# Patient Record
Sex: Female | Born: 1955 | Race: White | Hispanic: No | Smoking: Never smoker
Health system: Southern US, Academic
[De-identification: ages and names within clinical notes are randomized; demographics above are authoritative.]

## PROBLEM LIST (undated history)

## (undated) DIAGNOSIS — E538 Deficiency of other specified B group vitamins: Secondary | ICD-10-CM

## (undated) DIAGNOSIS — E559 Vitamin D deficiency, unspecified: Secondary | ICD-10-CM

## (undated) DIAGNOSIS — G4730 Sleep apnea, unspecified: Secondary | ICD-10-CM

## (undated) DIAGNOSIS — K219 Gastro-esophageal reflux disease without esophagitis: Secondary | ICD-10-CM

## (undated) DIAGNOSIS — I10 Essential (primary) hypertension: Secondary | ICD-10-CM

## (undated) DIAGNOSIS — D649 Anemia, unspecified: Secondary | ICD-10-CM

## (undated) HISTORY — PX: HX BREAST REDUCTION: SHX8

## (undated) HISTORY — DX: Essential (primary) hypertension: I10

## (undated) HISTORY — DX: Vitamin D deficiency, unspecified: E55.9

## (undated) HISTORY — PX: HX GASTRIC BYPASS: SHX52

## (undated) HISTORY — PX: HX GALL BLADDER SURGERY/CHOLE: SHX55

## (undated) HISTORY — PX: HX REFRACTIVE SURGERY: SHX103

## (undated) HISTORY — DX: Sleep apnea, unspecified: G47.30

## (undated) HISTORY — DX: Gastro-esophageal reflux disease without esophagitis: K21.9

## (undated) HISTORY — PX: CERVICAL SPINE SURGERY: SHX589

## (undated) HISTORY — DX: Anemia, unspecified: D64.9

## (undated) HISTORY — DX: Deficiency of other specified B group vitamins: E53.8

## (undated) HISTORY — PX: INCONTINENCE SURGERY: SHX676

## (undated) HISTORY — PX: HX GASTRIC SLEEVE: 2100003104

## (undated) HISTORY — PX: SALPINGECTOMY: SHX328

## (undated) HISTORY — PX: HX CARPAL TUNNEL RELEASE: SHX101

## (undated) HISTORY — PX: HX BACK SURGERY: SHX140

## (undated) HISTORY — PX: ELBOW SURGERY: SHX618

## (undated) HISTORY — PX: ORTHOPEDIC SURGERY: SHX850

---

## 1992-09-13 ENCOUNTER — Other Ambulatory Visit (HOSPITAL_COMMUNITY): Payer: Self-pay

## 2014-02-23 IMAGING — CT CT LUMBAR SPINE W/O CONTRAST
2 of 6 series · 11 of 20 positions shown, 13 images · non-contrast
Comparison: None.

Exam:   
Lumbar spine CT without contrast, low dose Safe CT protocol
HISTORY: Low back pain, bilateral leg pain.
TECHNIQUE: Multiple axial contiguous CT images of the lumbar spine submitted. Total DLP 769 mGy cm.

[Series 902: spine bone (safect) · axial · 0.38mm/px · z∈[-556,-372]mm · 8 of 120 slices shown, 10 images]
[im 14/120  soft-tissue]
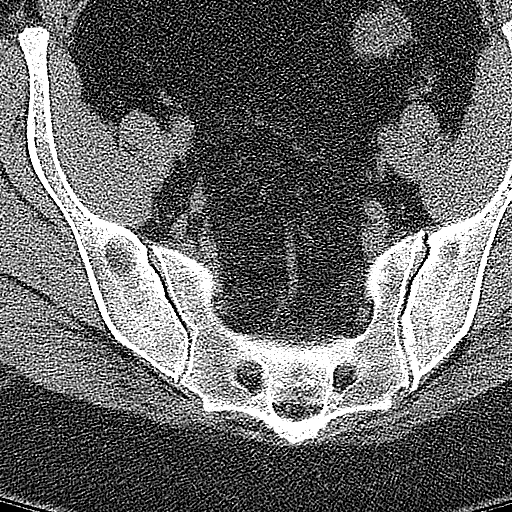
[im 14/120  bone]
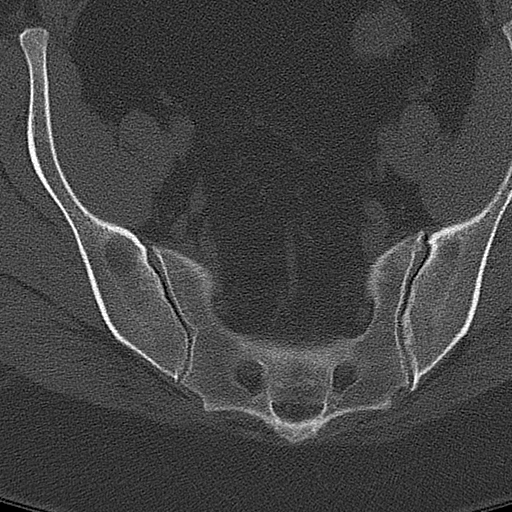
[im 27/120  bone]
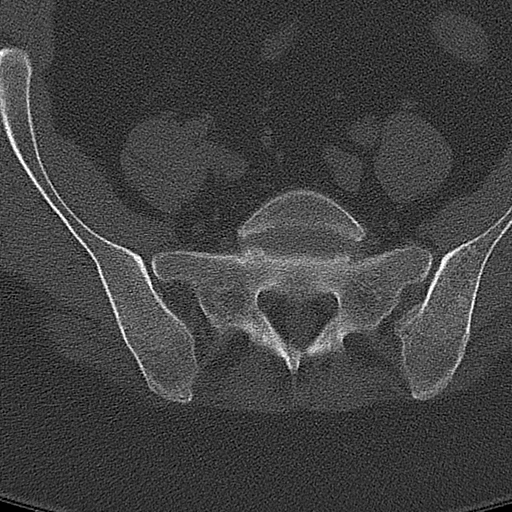
[im 40/120  bone]
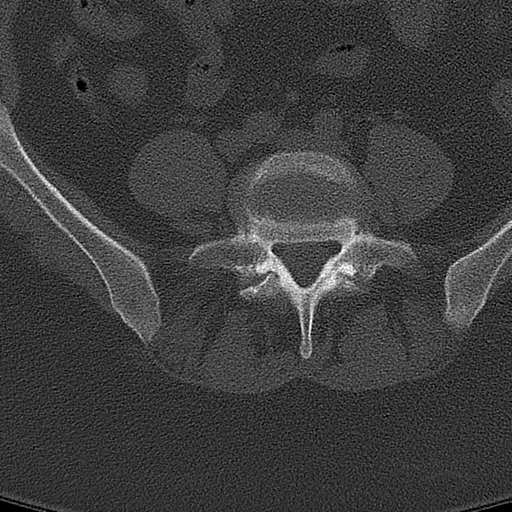
[im 53/120  bone]
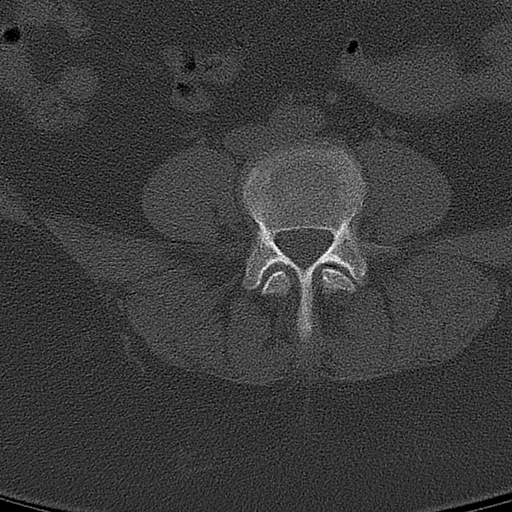
[im 67/120  soft-tissue]
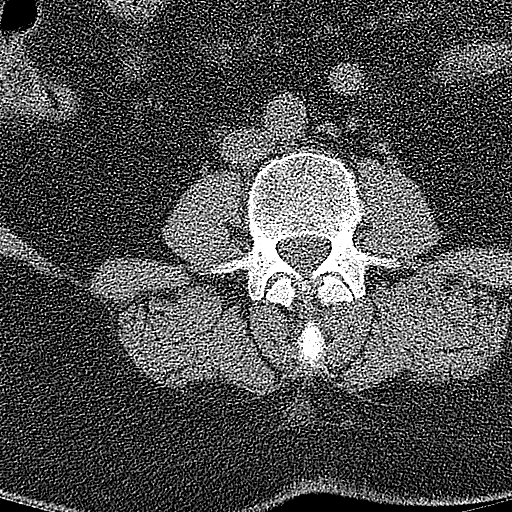
[im 67/120  bone]
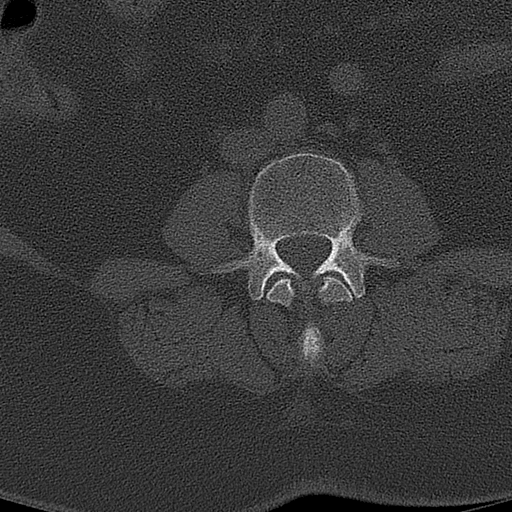
[im 80/120  bone]
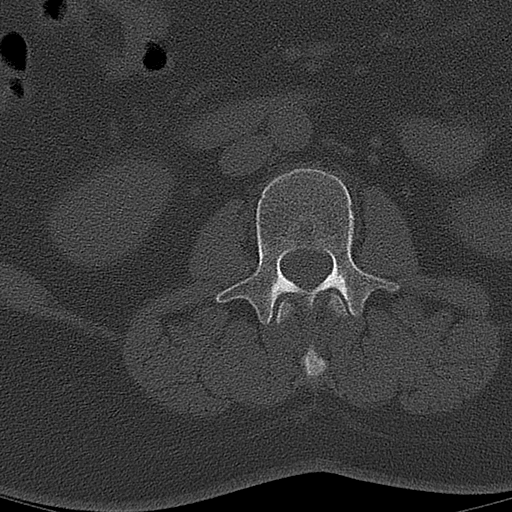
[im 93/120  bone]
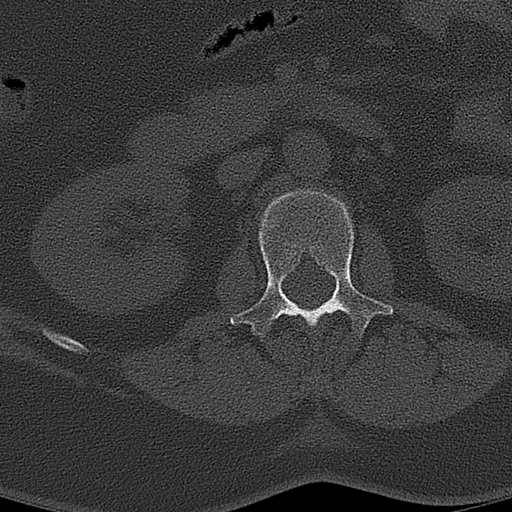
[im 106/120  bone]
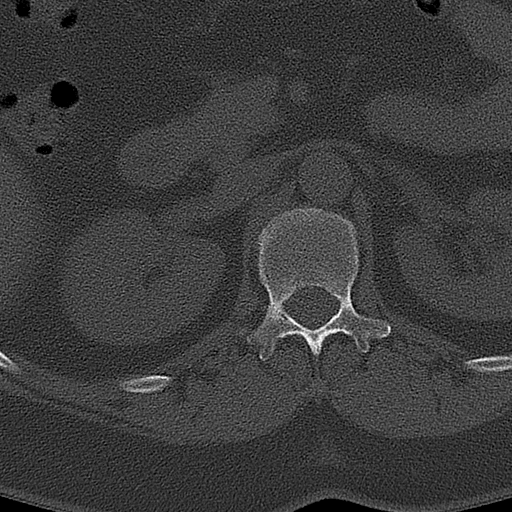

[Series 904: spine bone cor (safect) · coronal · 0.47mm/px · 3 of 56 slices shown]
[im 12/56  bone]
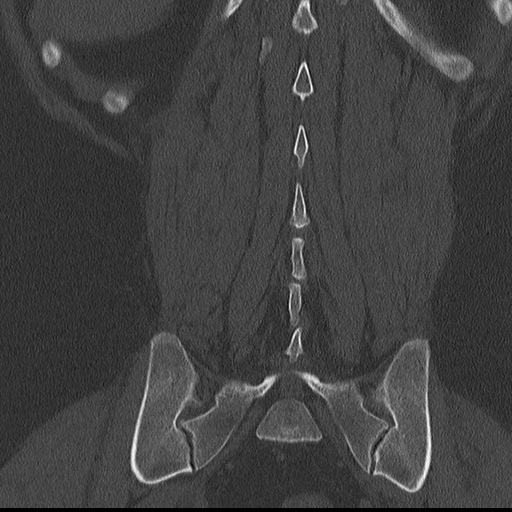
[im 23/56  bone]
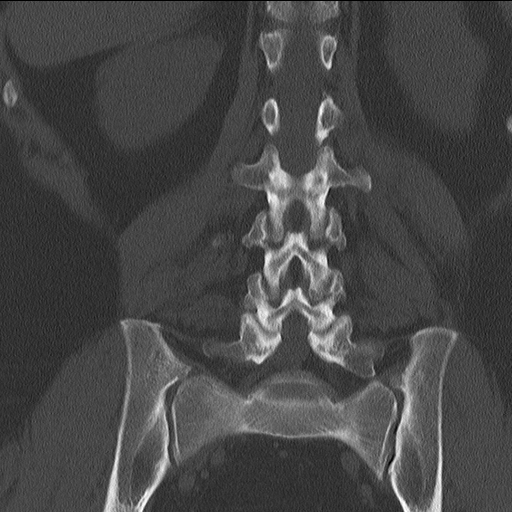
[im 34/56  bone]
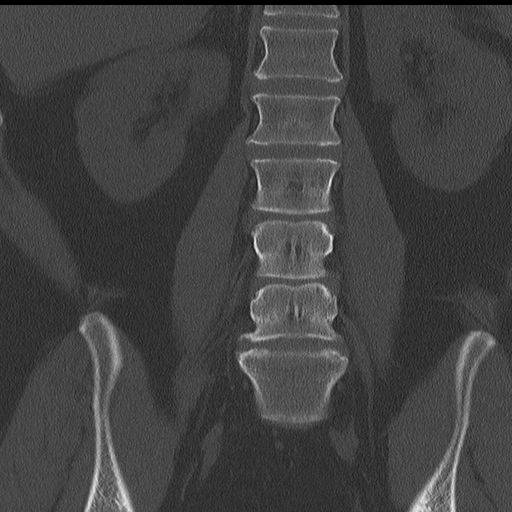

[11 of 20 positions shown; findings below may reference images not displayed]

FINDINGS: L1-2: Normal. 
L2-3: Broad disc bulge effacing the ventral thecal sac resulting in moderate central canal narrowing. There is mild facet disease without significant osseous neural foraminal narrowing. 
L3-4: Broad disc bulge effacing the ventral thecal sac resulting in mild central canal narrowing. There is no significant osseous neural foraminal narrowing. 
L4-5: There is moderate disc desiccation at this level. Circumferential broad disc bulge resulting in moderate to severe narrowing of the central canal. Additionally there is moderate facet arthrosis resulting in moderate biforaminal osseous narrowing. 
L5-S1: Small broad disc bulge without significant central canal narrowing. There is facet arthrosis resulting in mild biforaminal narrowing.
IMPRESSION: 1.
Multi level spondylosis most pronounced at the L4-5 level with broad disc bulge resulting in moderate to severe central canal and moderate biforaminal narrowing.

## 2016-11-03 DIAGNOSIS — G4730 Sleep apnea, unspecified: Secondary | ICD-10-CM

## 2016-11-03 HISTORY — DX: Sleep apnea, unspecified: G47.30

## 2021-03-08 IMAGING — MR MRI CERVICAL SPINE WITHOUT CONTRAST
4 of 5 series · 23 of 48 positions shown · IV contrast (gadolinium)
Comparison: None available.

﻿EXAM:  [id number]   MRI CERVICAL SPINE WITHOUT CONTRAST
INDICATION: Neck pain with bilateral upper extremity radiculopathy. History of neck surgery in 2558.
TECHNIQUE: Multiplanar multisequential MRI of the cervical spine was performed without gadolinium contrast.

[Series 5: T2 · sagittal · 3.0mm · 0.75mm/px · 8 of 15 slices shown (1 of 2)]
[im 1/15]
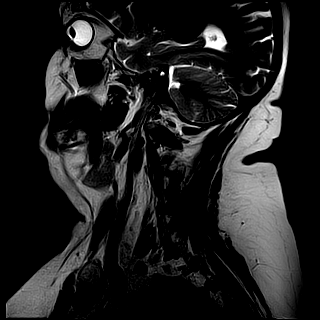
[im 3/15]
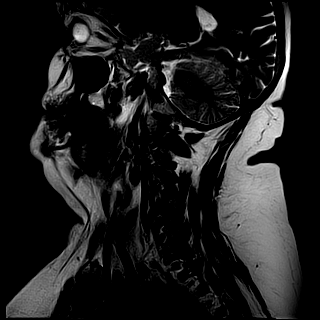
[im 5/15]
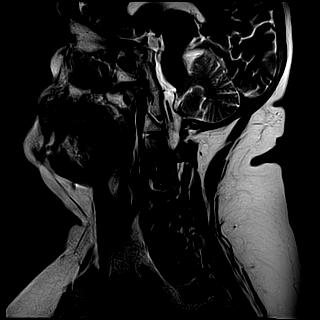
[im 7/15]
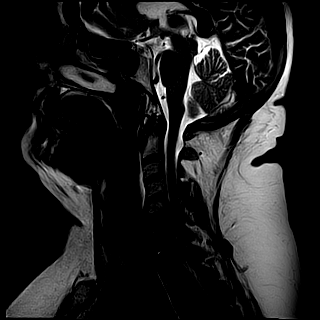
[im 9/15]
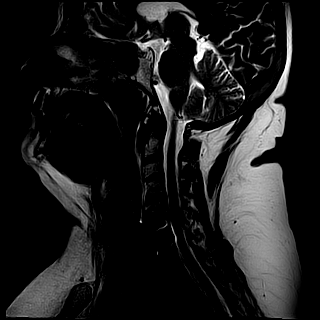
[im 11/15]
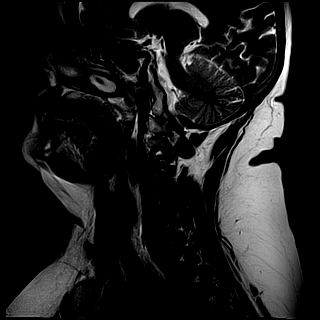
[im 13/15]
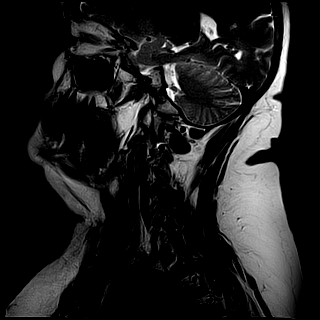
[im 15/15]
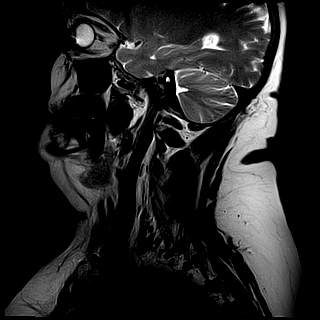

[Series 6: T1 · sagittal · 3.0mm · 0.47mm/px · 3 of 15 slices shown]
[im 2/15]
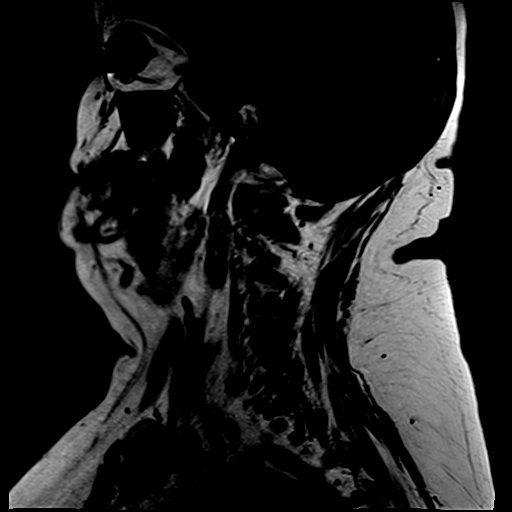
[im 8/15]
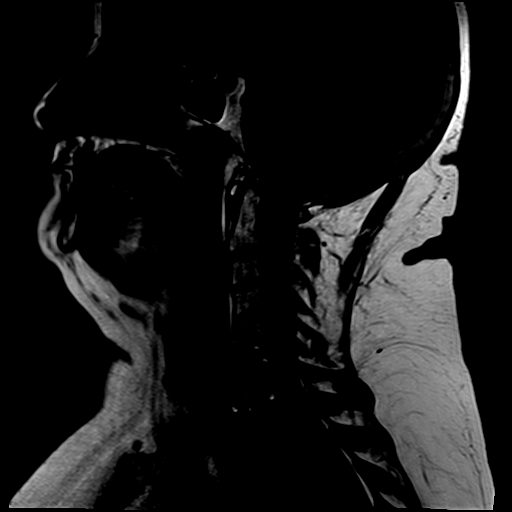
[im 13/15]
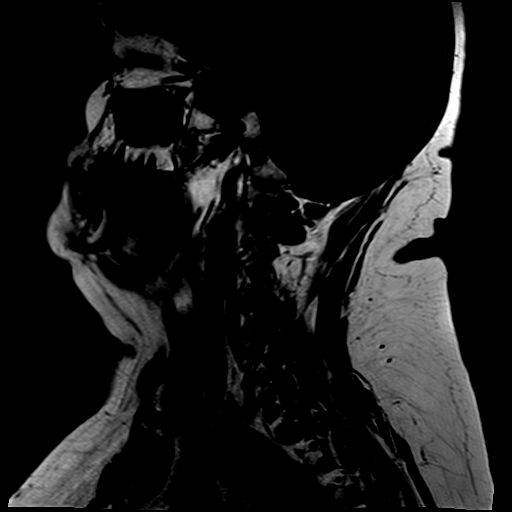

[Series 7: STIR · sagittal · 3.0mm · 0.47mm/px · 3 of 15 slices shown]
[im 2/15]
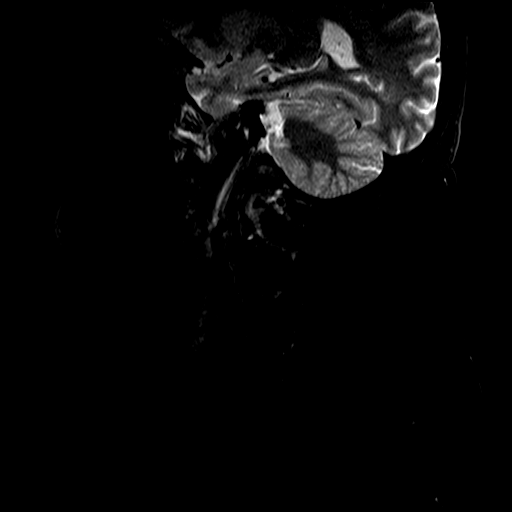
[im 8/15]
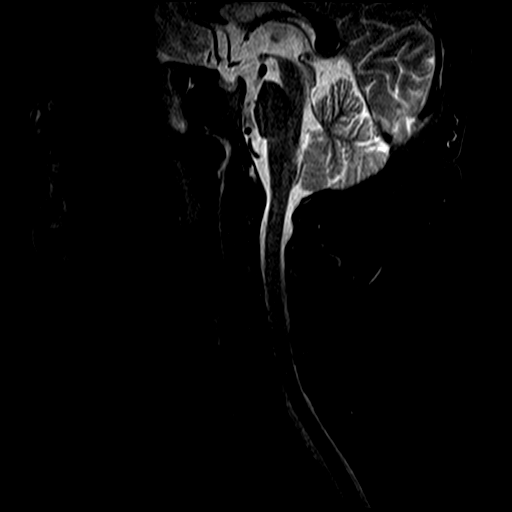
[im 13/15]
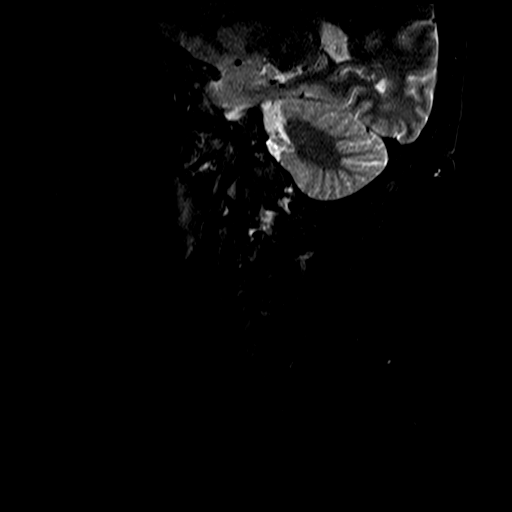

[Series 9: T2 · axial · 3.0mm · 0.39mm/px · z∈[-105,-22]mm · 9 of 18 slices shown (2 of 2)]
[im 1/18]
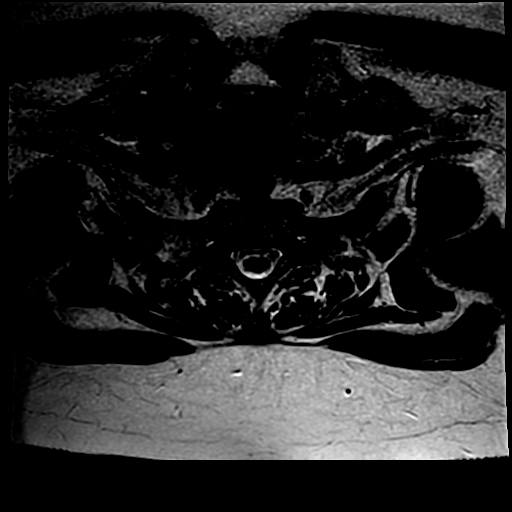
[im 2/18]
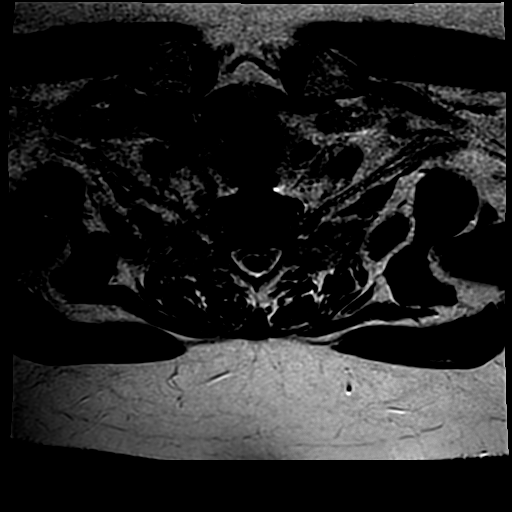
[im 4/18]
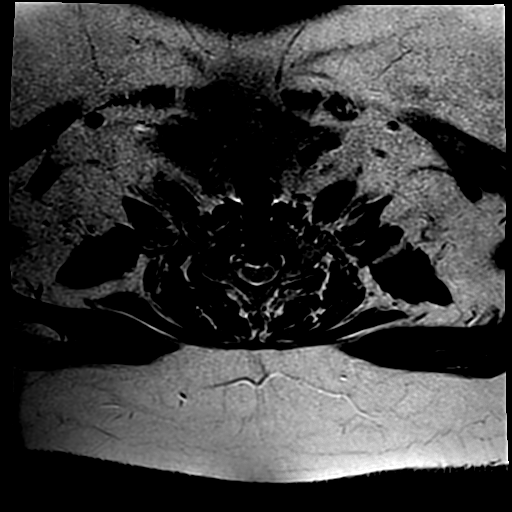
[im 6/18]
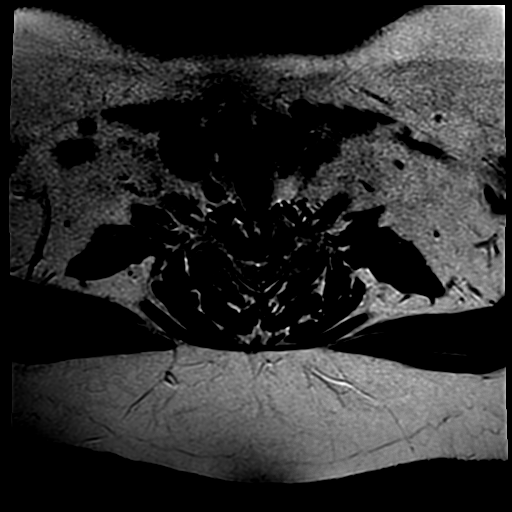
[im 7/18]
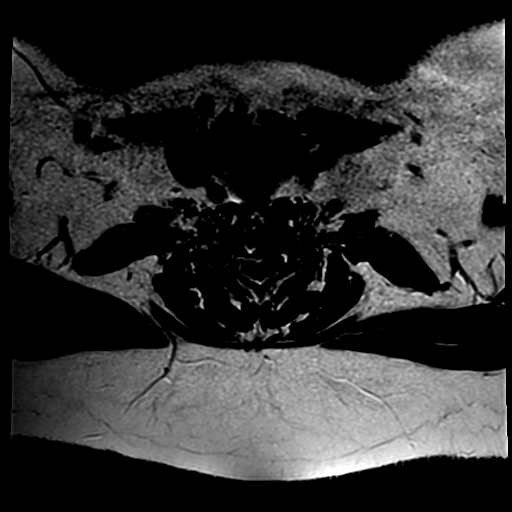
[im 9/18]
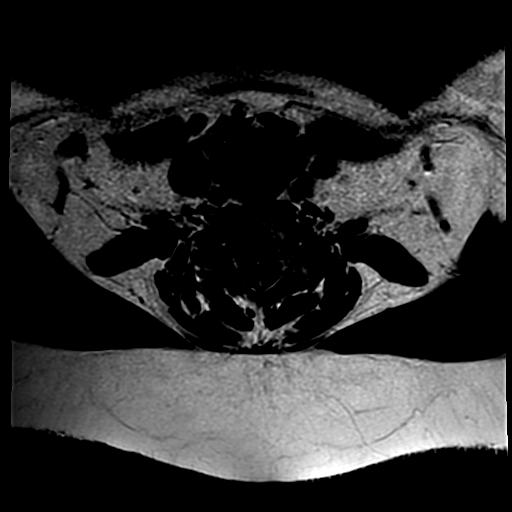
[im 11/18]
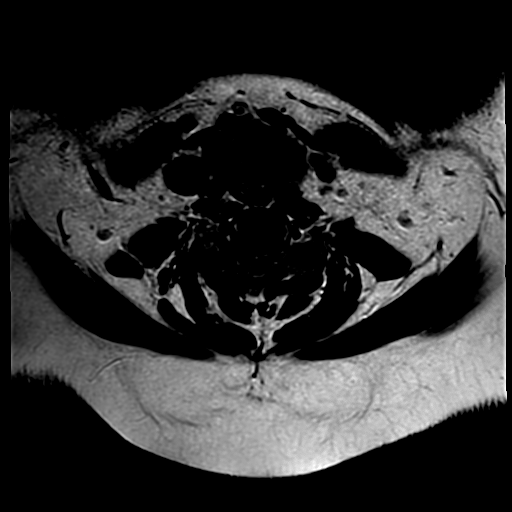
[im 12/18]
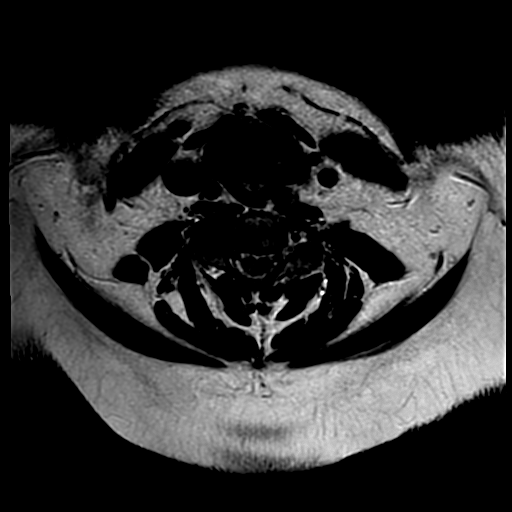
[im 16/18]
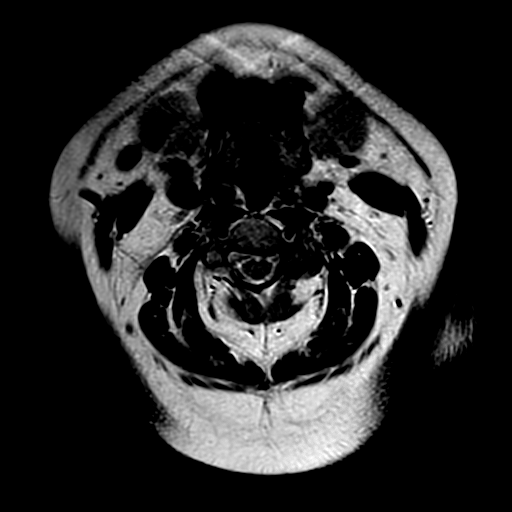

[23 of 48 positions shown; findings below may reference images not displayed]

FINDINGS: Vertebral bodies are normal in height, alignment and signal intensity. There is no acute fracture or subluxation. There is anterior surgical fusion of C6 and C7 vertebral bodies with metallic plate and screws. Visualized spinal cord is normal in signal intensity without evidence of compression at any level.

At C2-3 level, there is moderate to severe left neural foraminal stenosis from facet and uncovertebral joint hypertrophy.

At C3-4 level, there is severe bilateral neural foraminal stenosis from facet and uncovertebral joint hypertrophy.

At C4-5 level, there is mild-to-moderate right neural foraminal stenosis from facet and uncovertebral joint hypertrophy.

At C5-6 level, there is a small broad-based central disc bulge resulting in mild spinal stenosis. There is no significant neural foraminal stenosis.

At C6-7 level, there is mild-to-moderate bilateral neural foraminal stenosis from facet and uncovertebral joint hypertrophy.

At C7-T1 level, there is a small broad-based central disc bulge, mildly effacing the ventral CSF. There is moderate to severe left neural foraminal stenosis from facet and uncovertebral joint hypertrophy.

Paraspinal soft tissues are unremarkable.
IMPRESSION: 1. Anterior surgical fusion of C6 and C7 vertebral bodies. 

2. Mild spinal stenosis at C5-6 level from a small central disc bulge. 

3. Multilevel neural foraminal stenosis as detailed above.

## 2021-03-08 IMAGING — MR MRI LUMBAR SPINE WITHOUT CONTRAST
6 series · 48 of 48 positions shown · IV contrast (gadolinium)
Comparison: CT lumbar spine dated 02/23/2014.

﻿EXAM:  [id number]   MRI LUMBAR SPINE WITHOUT CONTRAST
INDICATION: Lower back pain.
TECHNIQUE: Multiplanar multisequential MRI of the lumbar spine was performed without gadolinium contrast.

[Series 4: T2 · sagittal · 5.0mm · 1.00mm/px · 5 of 13 slices shown (1 of 3)]
[im 1/13]
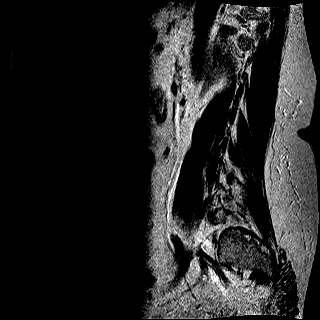
[im 4/13]
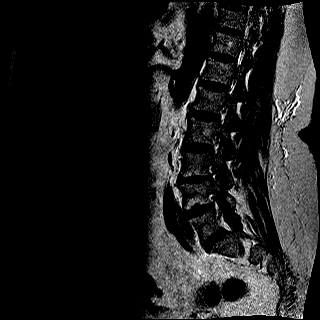
[im 7/13]
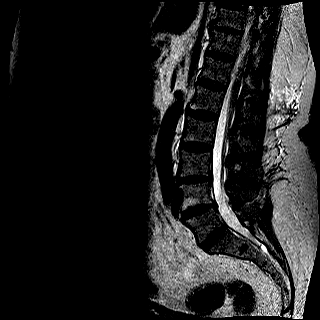
[im 10/13]
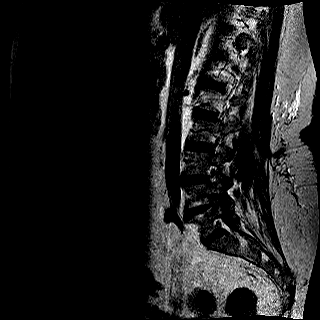
[im 13/13]
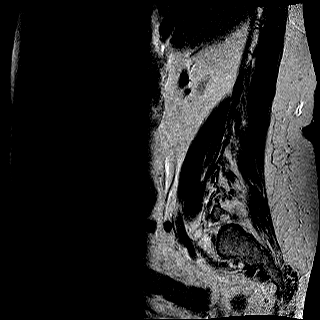

[Series 5: T1 · sagittal · 5.0mm · 1.00mm/px · 6 of 13 slices shown (1 of 2)]
[im 1/13]
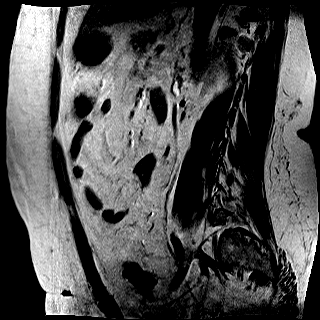
[im 3/13]
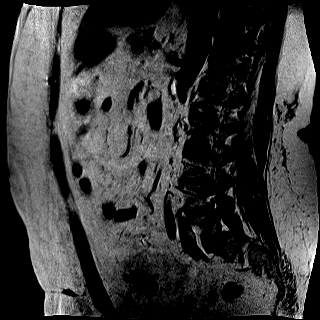
[im 5/13]
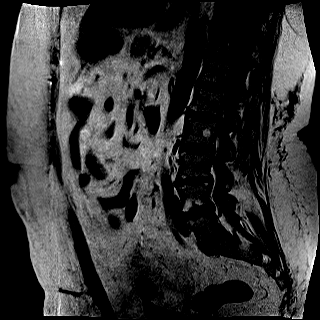
[im 8/13]
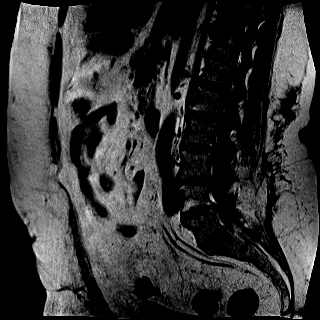
[im 10/13]
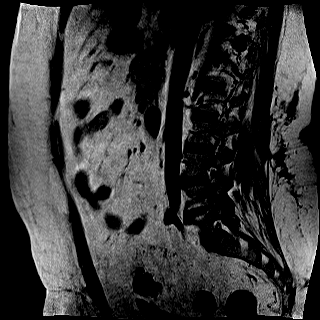
[im 13/13]
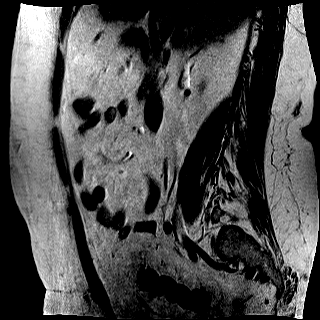

[Series 6: STIR · sagittal · 5.0mm · 1.25mm/px · 6 of 13 slices shown]
[im 1/13]
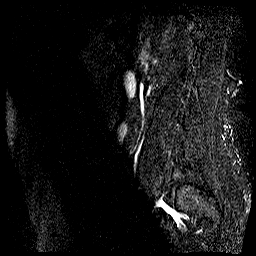
[im 3/13]
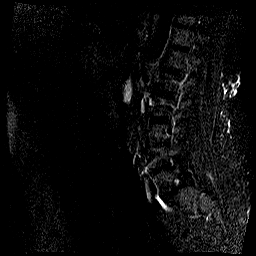
[im 5/13]
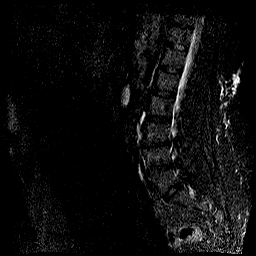
[im 8/13]
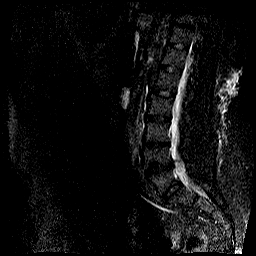
[im 10/13]
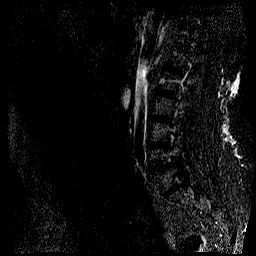
[im 13/13]
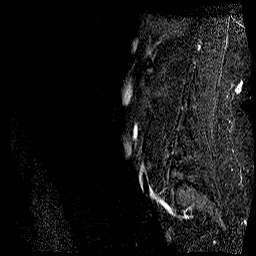

[Series 7: T2 · axial · 5.0mm · 0.89mm/px · z∈[-128,+78]mm · 11 of 25 slices shown (2 of 3)]
[im 1/25]
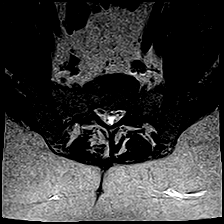
[im 3/25]
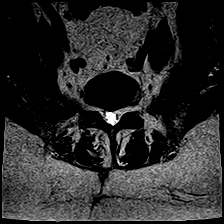
[im 5/25]
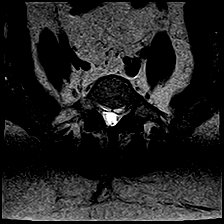
[im 8/25]
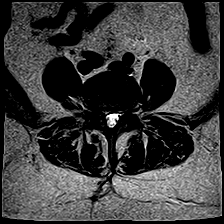
[im 10/25]
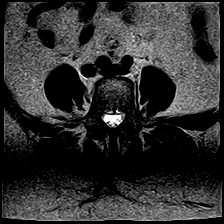
[im 13/25]
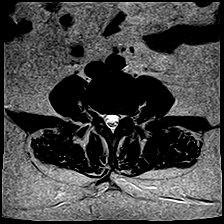
[im 15/25]
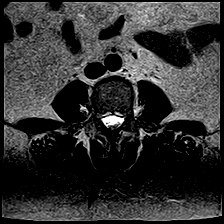
[im 17/25]
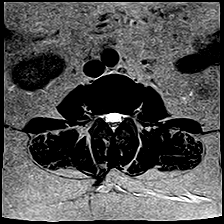
[im 20/25]
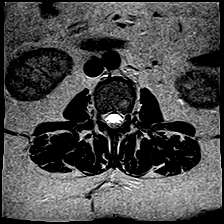
[im 22/25]
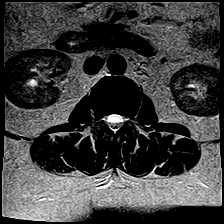
[im 25/25]
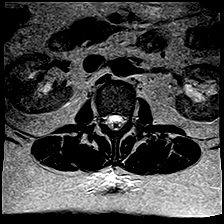

[Series 8: T1 · axial · 5.0mm · 0.89mm/px · z∈[-128,+78]mm · 11 of 25 slices shown (2 of 2)]
[im 1/25]
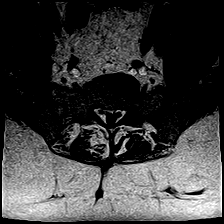
[im 3/25]
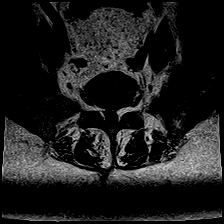
[im 5/25]
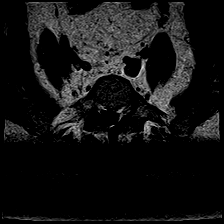
[im 8/25]
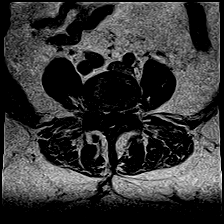
[im 10/25]
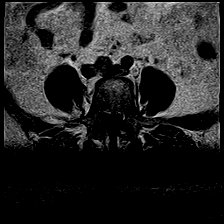
[im 13/25]
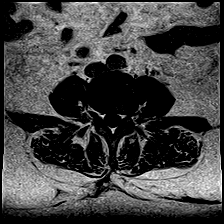
[im 15/25]
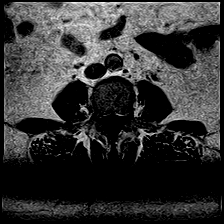
[im 17/25]
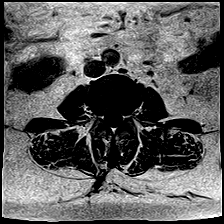
[im 20/25]
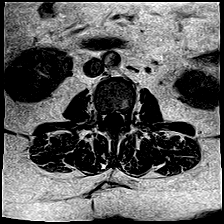
[im 22/25]
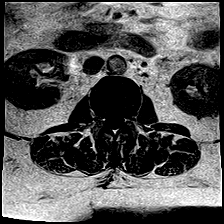
[im 25/25]
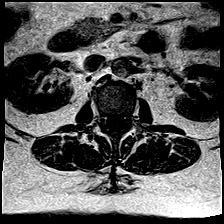

[Series 9: T2 · coronal · 4.0mm · 1.79mm/px · 9 of 20 slices shown (3 of 3)]
[im 1/20]
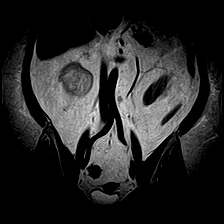
[im 3/20]
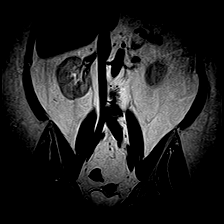
[im 5/20]
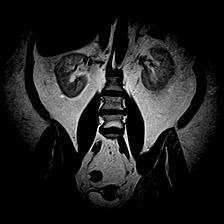
[im 8/20]
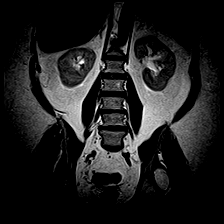
[im 10/20]
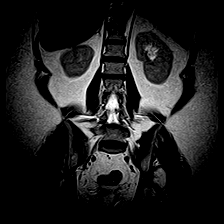
[im 12/20]
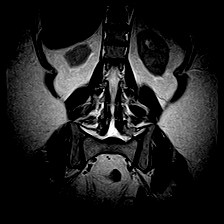
[im 15/20]
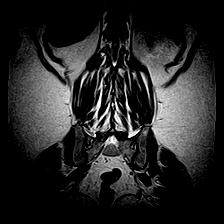
[im 17/20]
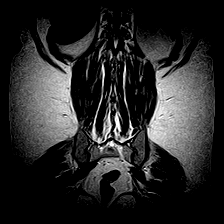
[im 20/20]
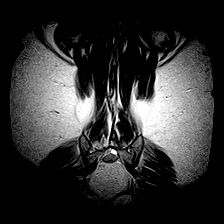

[48 of 48 positions shown; findings below may reference images not displayed]

FINDINGS: Vertebral bodies are normal in height, alignment and signal intensity. There is no acute fracture or subluxation. Distal spinal cord is normal in signal intensity and terminates normally at L1 vertebral body level. Spinal canal is congenitally narrow.

L1-2 and L2-3 levels are unremarkable.

At L3-4 level, there is a small broad-based central disc bulge, mildly effacing the ventral thecal sac. There is moderate left and mild right neural foraminal stenosis from facet arthropathy and bulging annulus.

At L4-5 level, there is a small broad-based central disc bulge, mildly effacing the ventral thecal sac. There is suggestion of a right hemilaminectomy defect at this level. There is mild-to-moderate left and moderate to severe right neural foraminal stenosis from facet arthropathy and bulging annulus.

L5-S1 level and paraspinal soft tissues are unremarkable.
IMPRESSION: 1. Suggestion of a right hemilaminectomy defect at L4-5 level. 

2. No significant disc herniation or spinal stenosis at any level. 

3. Multilevel neural foraminal stenosis as detailed above.

## 2021-06-03 IMAGING — MR ARTHROGRAM MRI LT SHOULDER
6 of 8 series · 27 of 40 positions shown · IV contrast (Gadavist)
Comparison: None available.

﻿EXAM:  ARTHROGRAM MRI LT SHOULDER
INDICATION: Chronic pain. History of remote surgery.
TECHNIQUE: Multiplanar multisequential MRI of the right shoulder joint was performed following intra-articular administration of 15 mL of diluted Gadavist.

[Series 6: T1 fat-sat · oblique · left · 3.5mm · 0.31mm/px · 6 of 22 slices shown (1 of 3)]
[im 1/22]
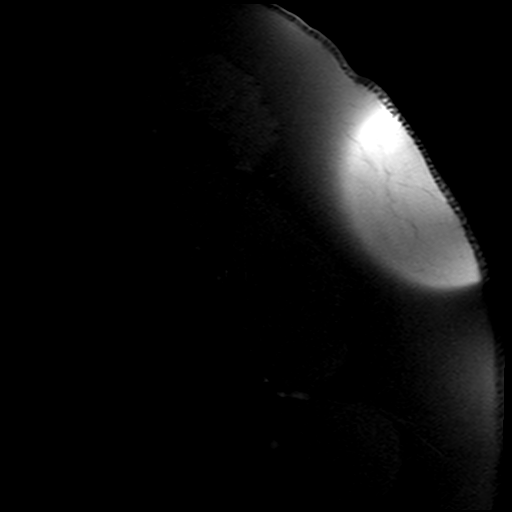
[im 5/22]
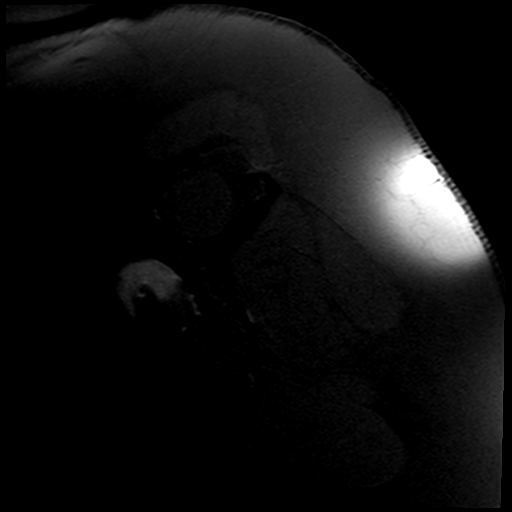
[im 9/22]
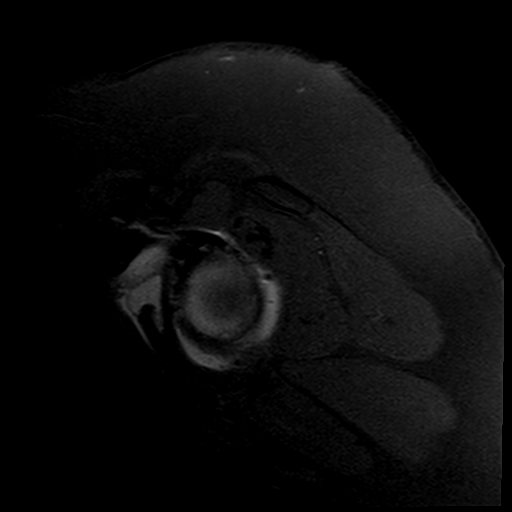
[im 13/22]
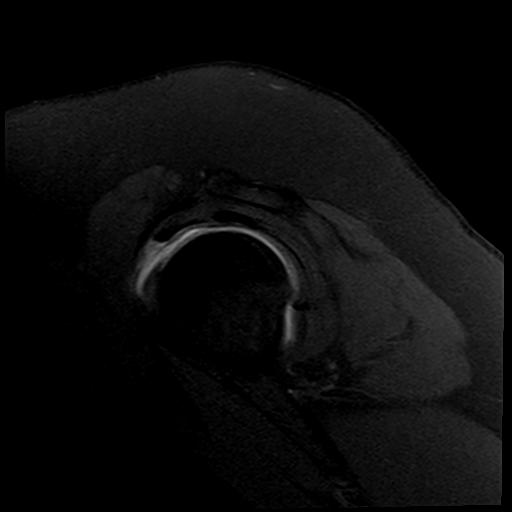
[im 17/22]
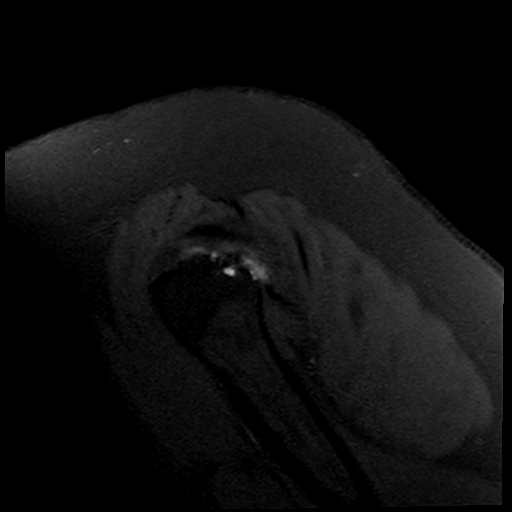
[im 22/22]
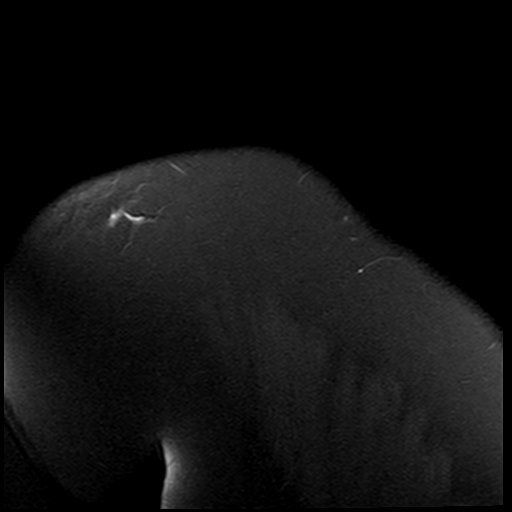

[Series 7: T1 fat-sat · axial · left · 4.0mm · 0.50mm/px · z∈[-101,-24]mm · 5 of 18 slices shown (2 of 3)]
[im 1/18]
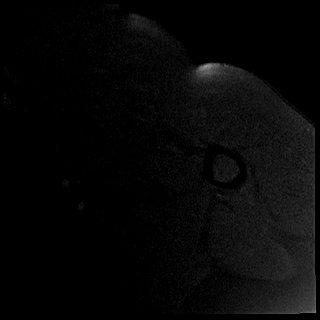
[im 5/18]
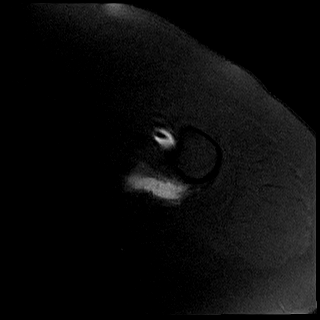
[im 9/18]
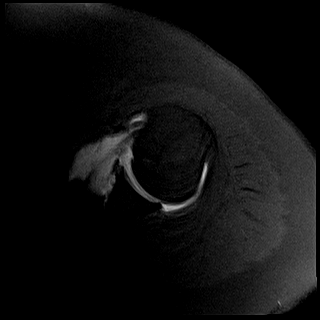
[im 13/18]
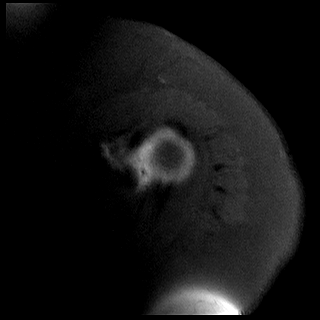
[im 18/18]
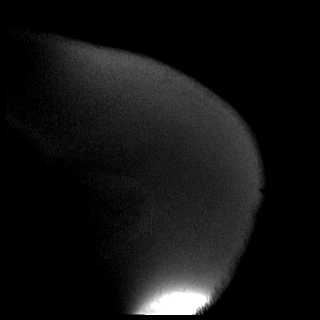

[Series 8: T1 fat-sat · oblique · left · 3.5mm · 0.31mm/px · 5 of 20 slices shown (3 of 3)]
[im 1/20]
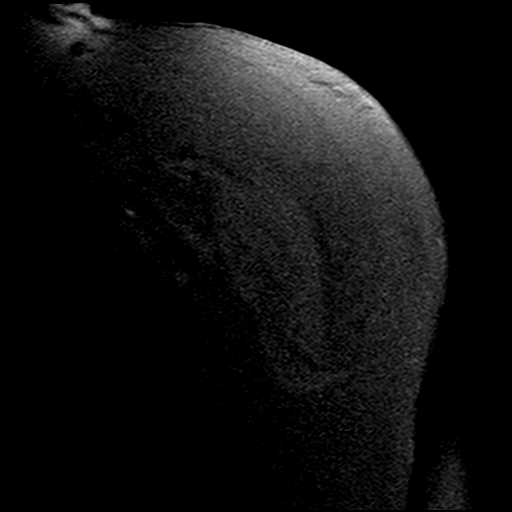
[im 5/20]
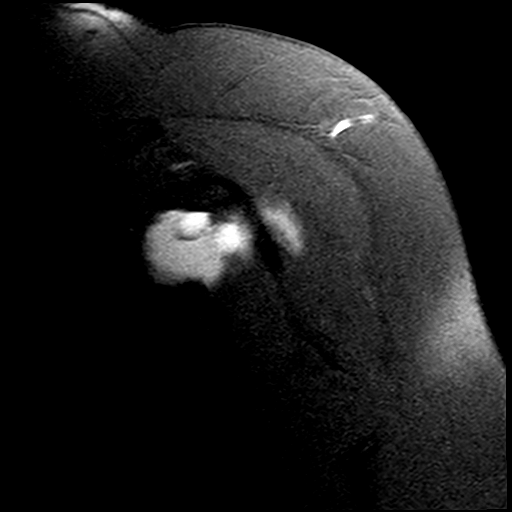
[im 10/20]
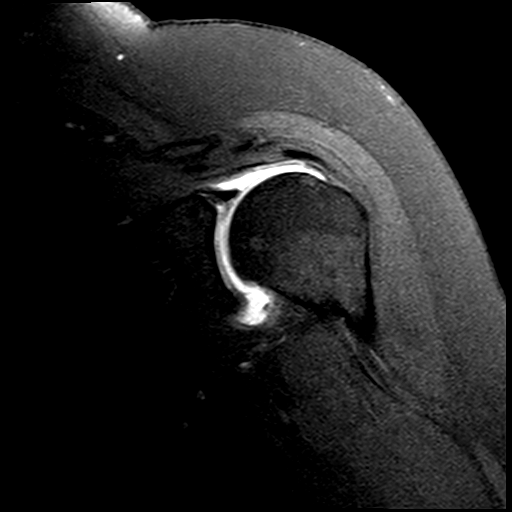
[im 15/20]
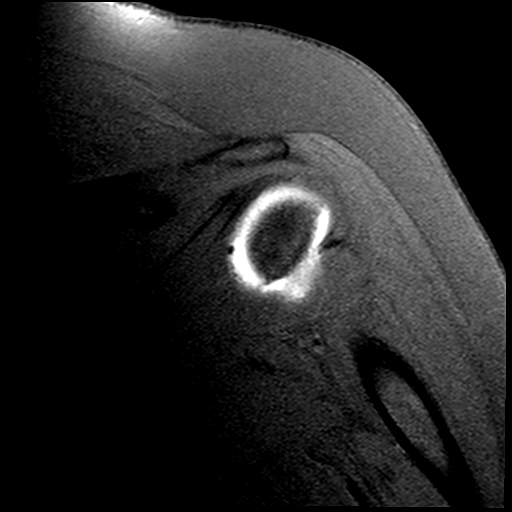
[im 20/20]
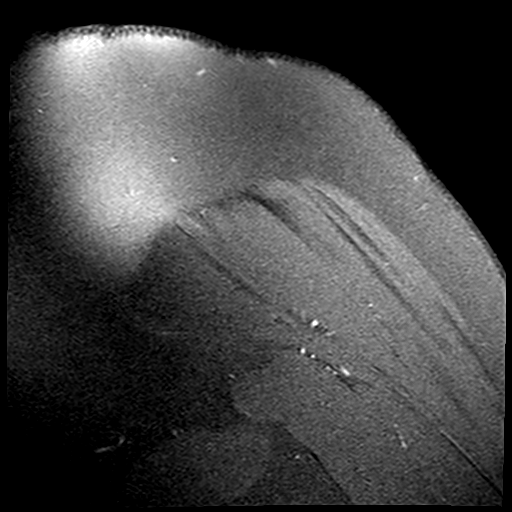

[Series 9: PD fat-sat · oblique · left · 3.5mm · 0.50mm/px · 5 of 20 slices shown]
[im 1/20]
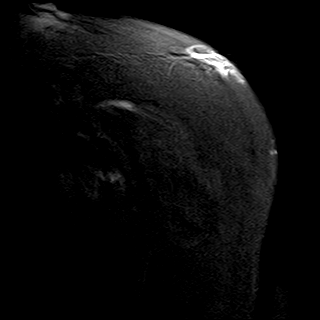
[im 5/20]
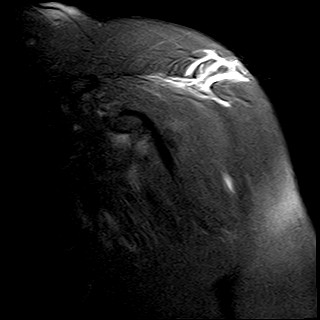
[im 10/20]
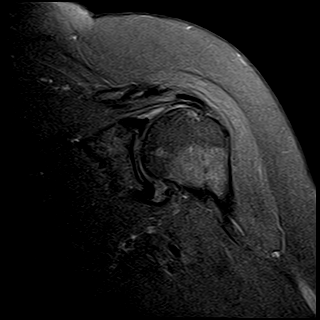
[im 15/20]
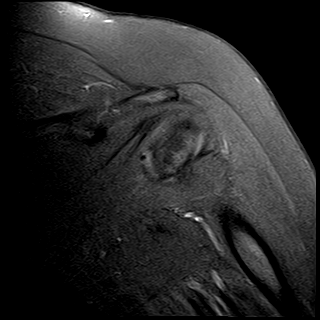
[im 20/20]
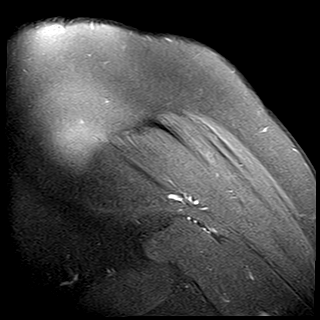

[Series 10: T2 fat-sat · oblique · left · 3.5mm · 0.36mm/px · 5 of 20 slices shown]
[im 1/20]
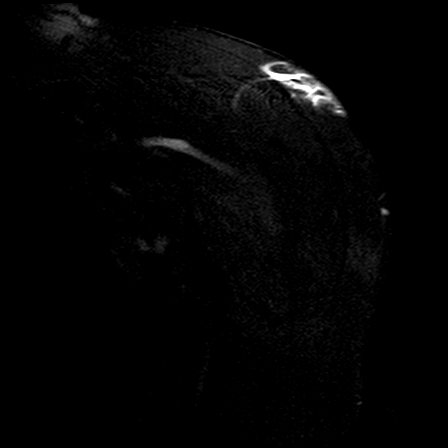
[im 5/20]
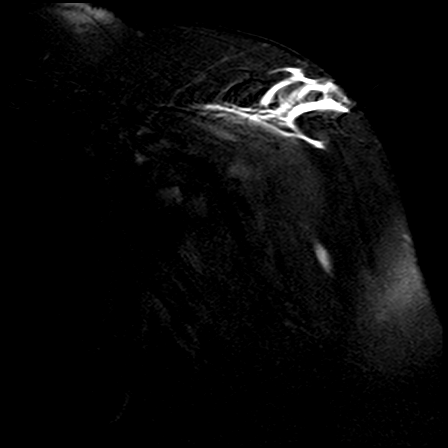
[im 10/20]
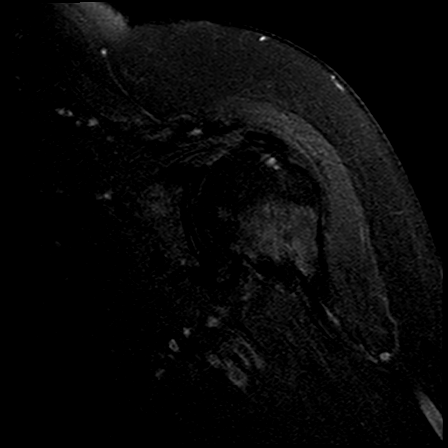
[im 15/20]
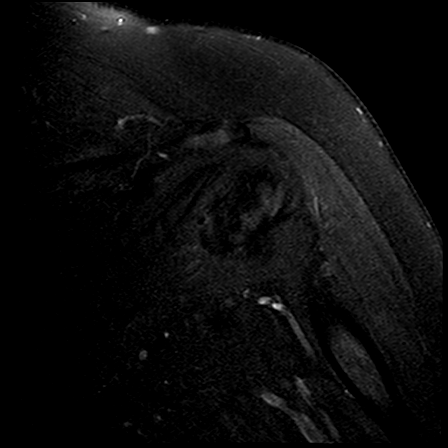
[im 20/20]
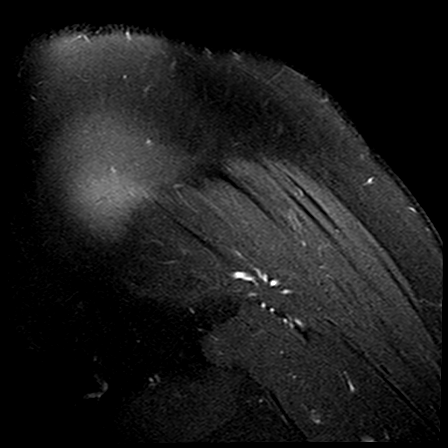

[Series 11: T1 · oblique · left · 3.5mm · 0.36mm/px · 1 of 22 slices shown]
[im 1/22]
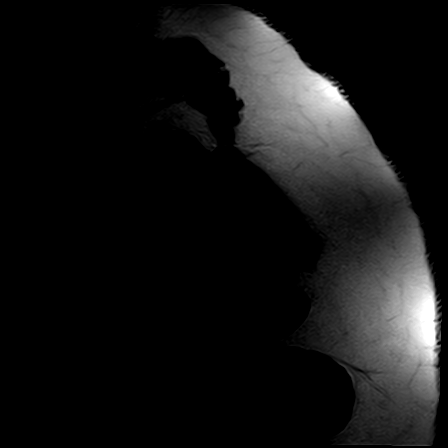

[27 of 40 positions shown; findings below may reference images not displayed]

FINDINGS: Supraspinatus, infraspinatus, teres minor and subscapularis muscles and tendons are within normal limits in morphology and signal intensity. Long head of biceps tendon is well seated within the intertubercular groove. There is a complex superior labral tear. Glenohumeral articular cartilage is well maintained. Acromioclavicular joint is unremarkable. No significant fluid is noted within the subacromial/subdeltoid bursa. Muscle bulk and bone marrow signal intensity are normal. Mild degenerative cystic changes are noted within the humeral head underlying infraspinatus tendon insertion. No mass is seen along the course of the suprascapular nerve, within the spinoglenoid notch or within the quadrilateral space.
IMPRESSION: 1. Intact rotator cuff. 

2. Complex superior labral tear. 

3. Unremarkable acromioclavicular joint.

## 2021-10-15 IMAGING — US ABD LIMITED
1 series · 14 of 25 positions shown · non-contrast
Comparison: 03/07/2019.

﻿EXAM:  [employer]ATS[employer] PROF[employer] READ ABD U/S LMTD
INDICATION: Fatty liver.

[Series 1: abd limited · 14 of 46 slices shown]
[im 1/46]
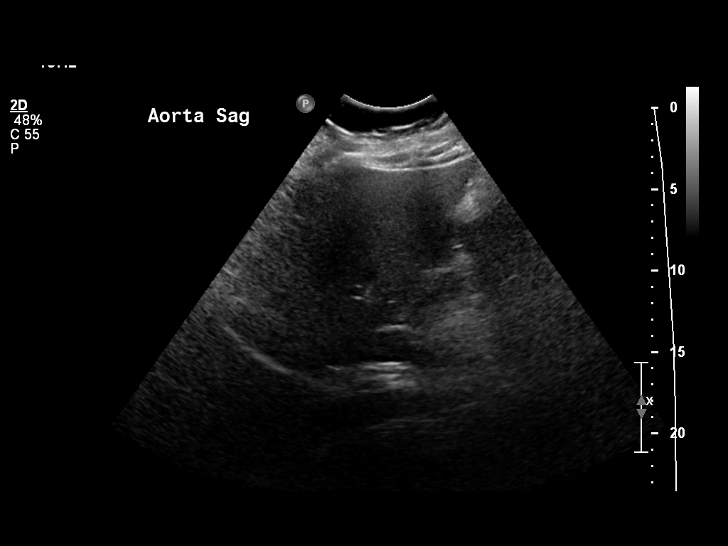
[im 4/46]
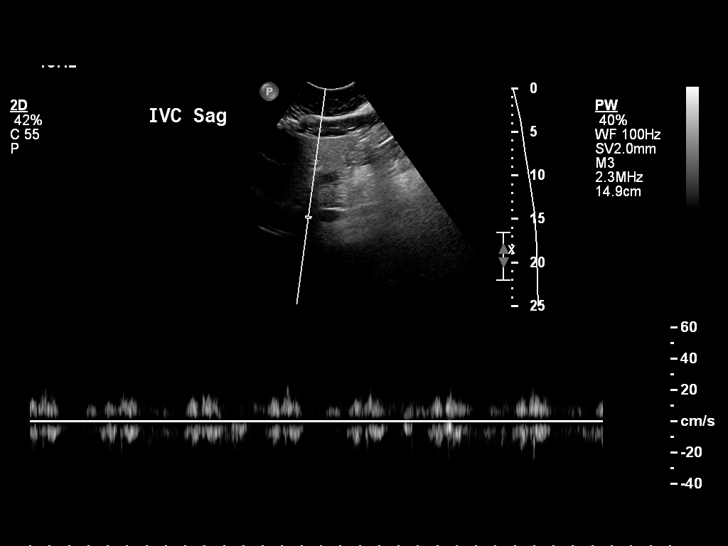
[im 8/46]
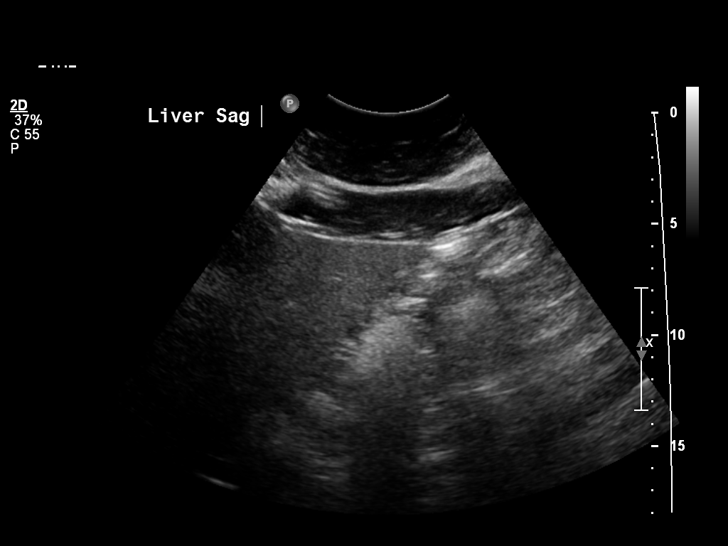
[im 12/46]
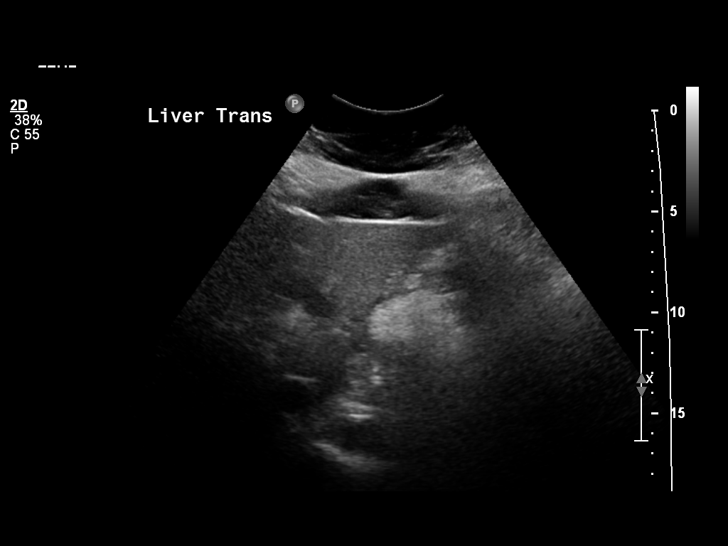
[im 16/46]
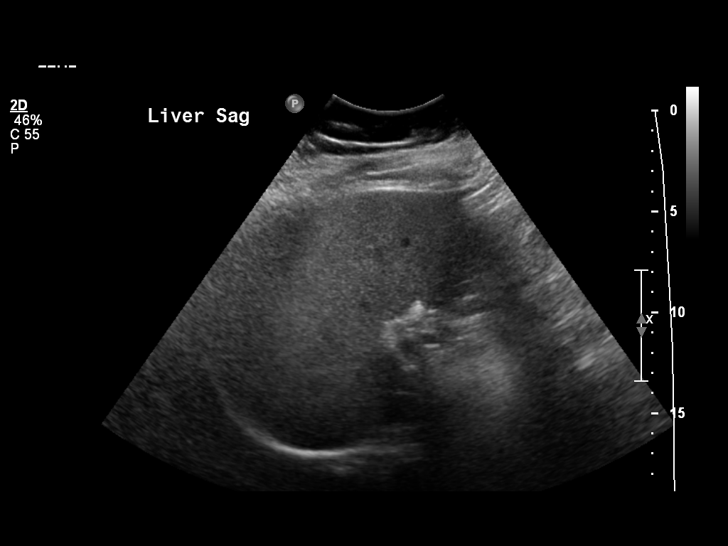
[im 17/46]
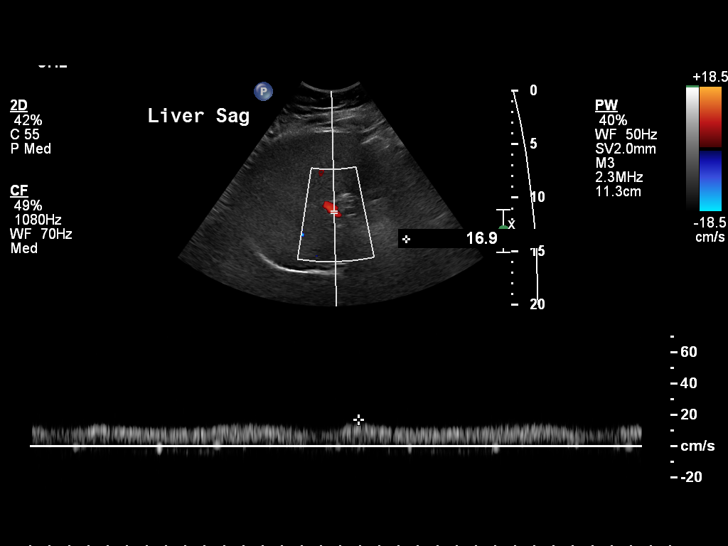
[im 21/46]
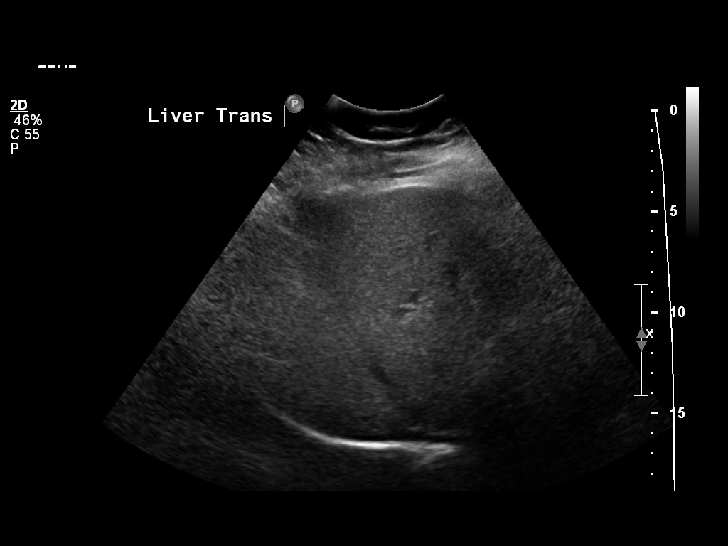
[im 25/46]
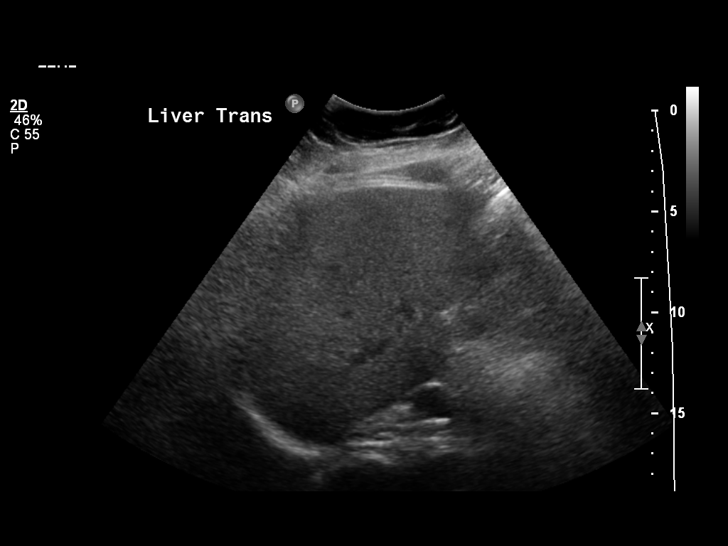
[im 29/46]
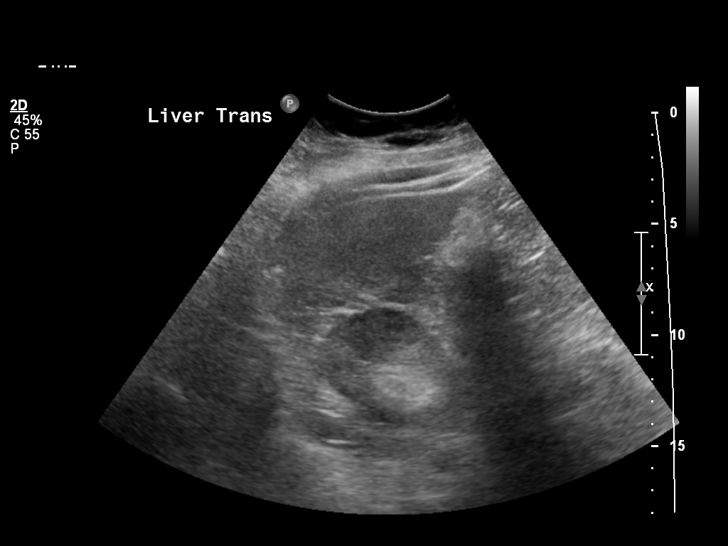
[im 31/46]
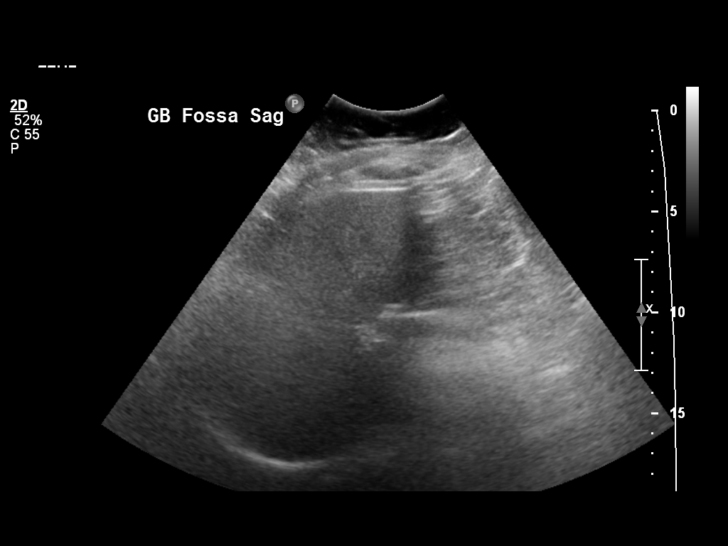
[im 34/46]
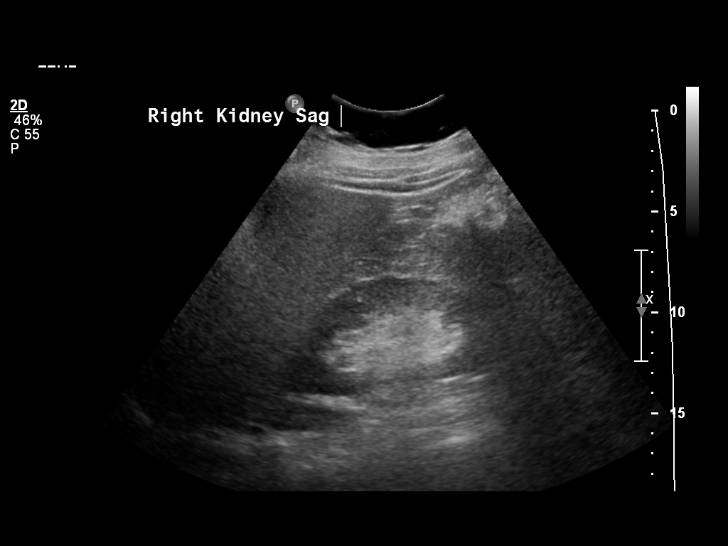
[im 38/46]
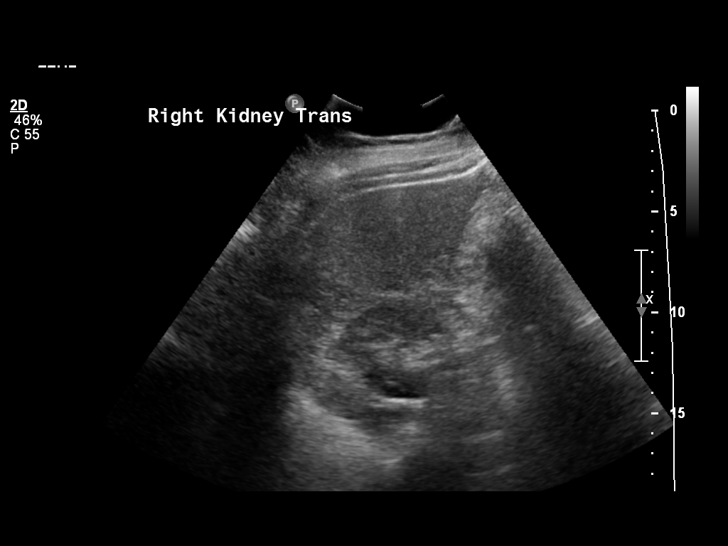
[im 42/46]
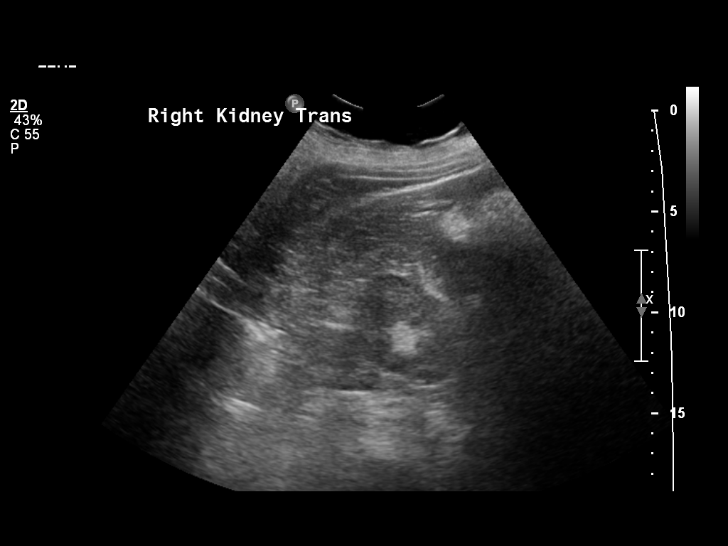
[im 46/46]
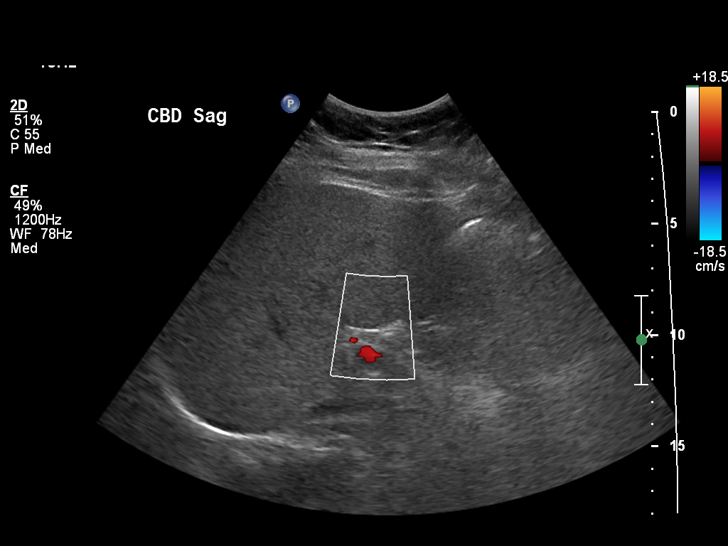

[14 of 25 positions shown; findings below may reference images not displayed]

FINDINGS: Liver is echogenic compatible with fatty infiltration. Fatty infiltration limits evaluation for focal hepatic mass. There is no intra or extrahepatic biliary ductal dilatation. Common bile duct measures 5.5 mm. Gallbladder is surgically absent. Pancreas is incompletely visualized due to artifact from overlying bowel gas. Right kidney measures 11 cm and is normal.

Visualized abdominal aorta is without aneurysmal dilatation. IVC is normal. Portal vein measures 7 mm in diameter and demonstrates hepatopetal flow. Hepatic veins are also patent. There is no ascites.
IMPRESSION: 1. Fatty liver. 

2. Prior cholecystectomy. 

3. Pancreas incompletely visualized due to artifact from overlying bowel gas.

## 2021-12-30 ENCOUNTER — Other Ambulatory Visit (HOSPITAL_COMMUNITY): Payer: Self-pay | Admitting: Family Medicine

## 2021-12-30 DIAGNOSIS — R233 Spontaneous ecchymoses: Secondary | ICD-10-CM

## 2022-01-05 ENCOUNTER — Inpatient Hospital Stay
Admission: RE | Admit: 2022-01-05 | Discharge: 2022-01-05 | Disposition: A | Discharge status: Home / Self Care | Payer: Managed Care, Other (non HMO) | Source: Ambulatory Visit | Attending: Family Medicine | Admitting: Family Medicine

## 2022-01-05 ENCOUNTER — Other Ambulatory Visit: Payer: Self-pay

## 2022-01-05 ENCOUNTER — Other Ambulatory Visit (HOSPITAL_COMMUNITY): Payer: Self-pay | Admitting: Family Medicine

## 2022-01-05 DIAGNOSIS — M549 Dorsalgia, unspecified: Secondary | ICD-10-CM | POA: Insufficient documentation

## 2022-01-06 DIAGNOSIS — M549 Dorsalgia, unspecified: Secondary | ICD-10-CM

## 2022-01-10 ENCOUNTER — Telehealth (INDEPENDENT_AMBULATORY_CARE_PROVIDER_SITE_OTHER): Payer: Self-pay | Admitting: Surgery

## 2022-01-13 ENCOUNTER — Encounter (INDEPENDENT_AMBULATORY_CARE_PROVIDER_SITE_OTHER): Payer: Self-pay | Admitting: Surgery

## 2022-01-13 ENCOUNTER — Telehealth (INDEPENDENT_AMBULATORY_CARE_PROVIDER_SITE_OTHER): Payer: Self-pay | Admitting: Surgery

## 2022-01-22 ENCOUNTER — Other Ambulatory Visit: Payer: Self-pay

## 2022-01-22 ENCOUNTER — Inpatient Hospital Stay
Admission: RE | Admit: 2022-01-22 | Discharge: 2022-01-22 | Disposition: A | Discharge status: Home / Self Care | Payer: Managed Care, Other (non HMO) | Source: Ambulatory Visit | Attending: ORTHOPEDIC, SPORTS MEDICINE | Admitting: ORTHOPEDIC, SPORTS MEDICINE

## 2022-01-22 ENCOUNTER — Other Ambulatory Visit (HOSPITAL_COMMUNITY): Payer: Self-pay | Admitting: ORTHOPEDIC, SPORTS MEDICINE

## 2022-01-22 DIAGNOSIS — M25511 Pain in right shoulder: Secondary | ICD-10-CM | POA: Insufficient documentation

## 2022-01-22 DIAGNOSIS — G8929 Other chronic pain: Secondary | ICD-10-CM | POA: Insufficient documentation

## 2022-01-23 DIAGNOSIS — G8929 Other chronic pain: Secondary | ICD-10-CM

## 2022-01-23 DIAGNOSIS — M25511 Pain in right shoulder: Secondary | ICD-10-CM

## 2022-01-28 ENCOUNTER — Encounter (INDEPENDENT_AMBULATORY_CARE_PROVIDER_SITE_OTHER): Payer: Self-pay | Admitting: Surgery

## 2022-02-12 IMAGING — MR MRI SHOULDER RT W/O CONTRAST
6 of 8 series · 29 of 40 positions shown · IV contrast (gadolinium)
Comparison: MRI dated 04/10/2014.

﻿EXAM:  [id number]   MRI SHOULDER RT W/O CONTRAST
INDICATION: Pain.
TECHNIQUE: Multiplanar multisequential MRI of the right shoulder joint was performed without gadolinium contrast.

[Series 6: T1 · oblique · right · 4.0mm · 0.31mm/px · 5 of 22 slices shown (1 of 2)]
[im 1/22]
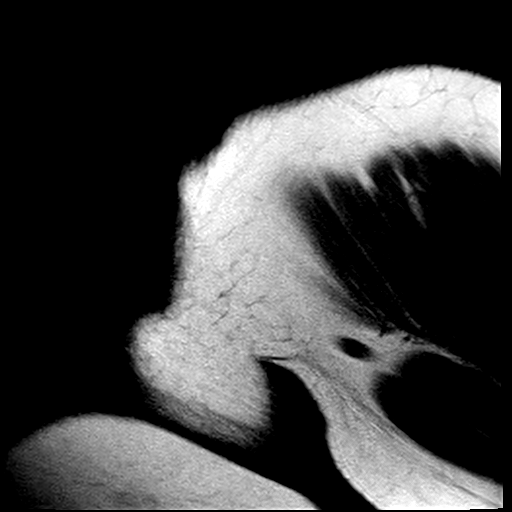
[im 6/22]
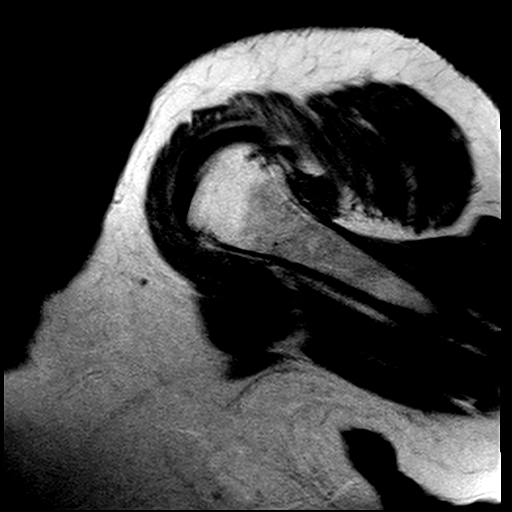
[im 11/22]
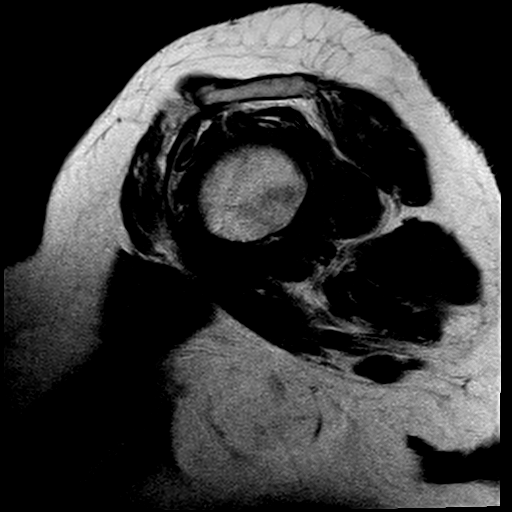
[im 16/22]
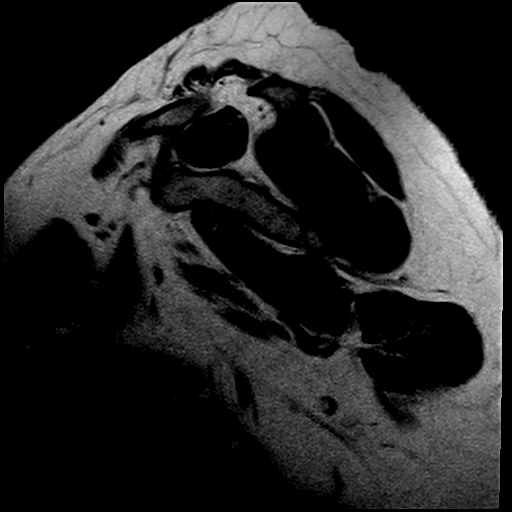
[im 22/22]
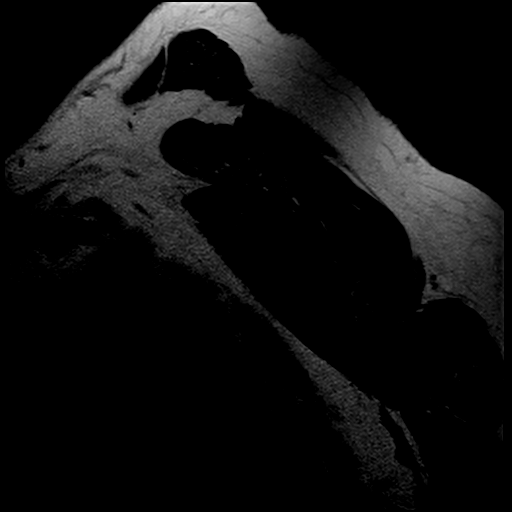

[Series 7: T2 · oblique · right · 4.0mm · 0.42mm/px · 4 of 22 slices shown]
[im 1/22]
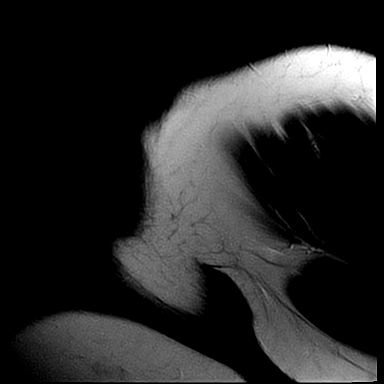
[im 6/22]
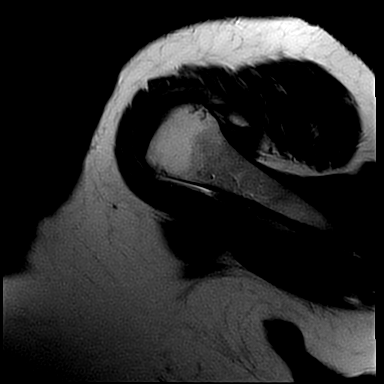
[im 11/22]
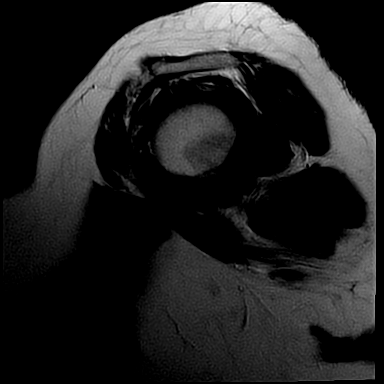
[im 16/22]
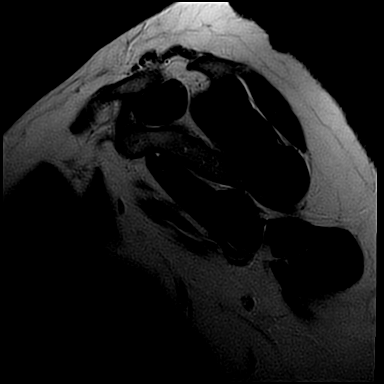

[Series 9: PD fat-sat · axial · right · 4.0mm · 0.36mm/px · z∈[-43,+58]mm · 5 of 24 slices shown (1 of 2)]
[im 1/24]
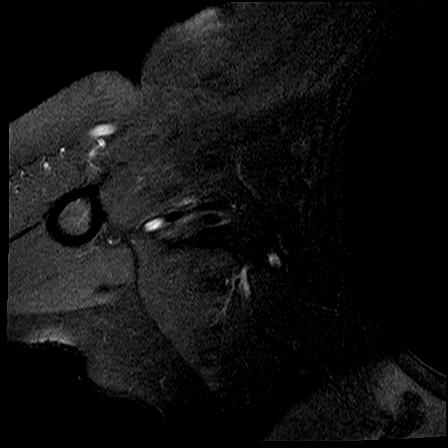
[im 6/24]
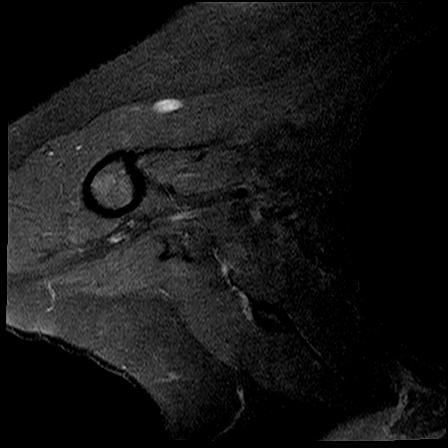
[im 12/24]
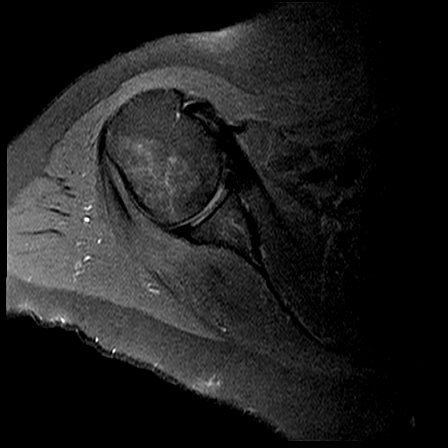
[im 18/24]
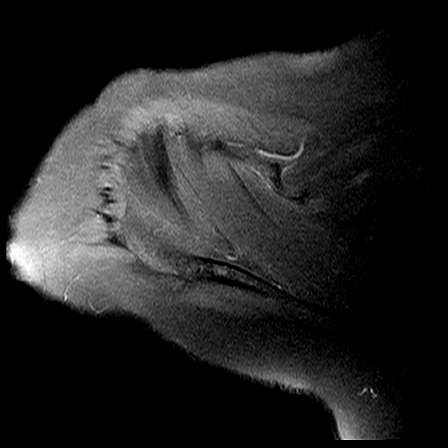
[im 24/24]
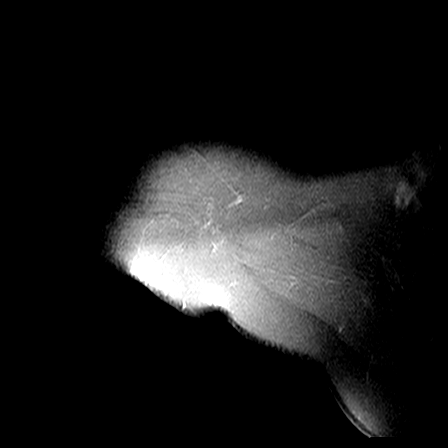

[Series 10: T2 fat-sat · axial · right · 4.0mm · 0.42mm/px · z∈[-43,+58]mm · 5 of 24 slices shown]
[im 1/24]
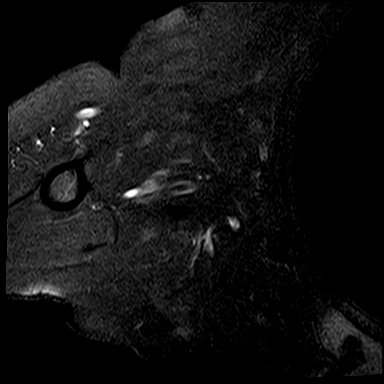
[im 6/24]
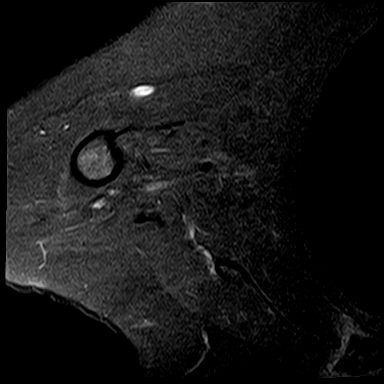
[im 12/24]
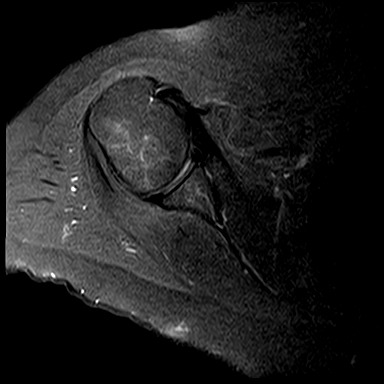
[im 18/24]
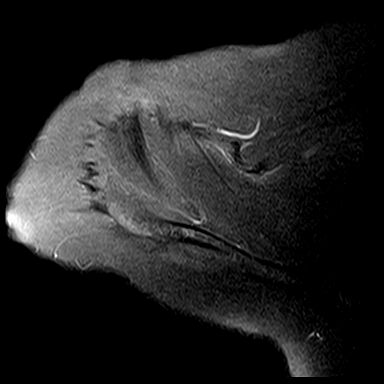
[im 24/24]
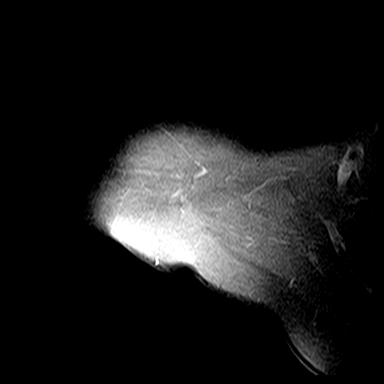

[Series 11: PD fat-sat · oblique · right · 4.0mm · 0.50mm/px · 5 of 23 slices shown (2 of 2)]
[im 1/23]
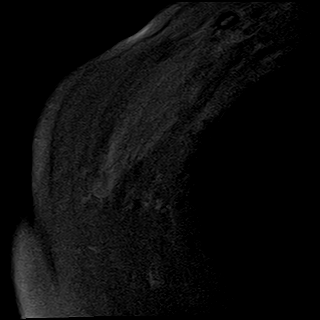
[im 6/23]
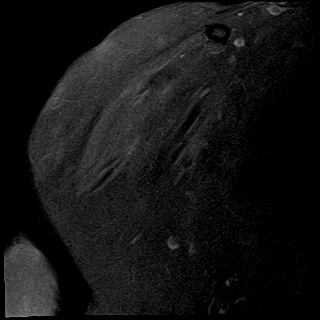
[im 12/23]
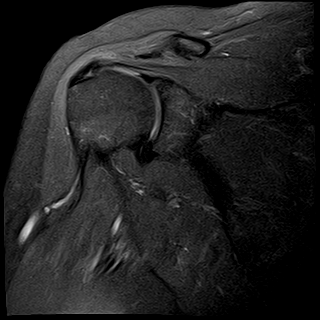
[im 17/23]
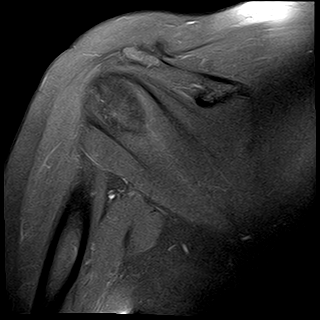
[im 23/23]
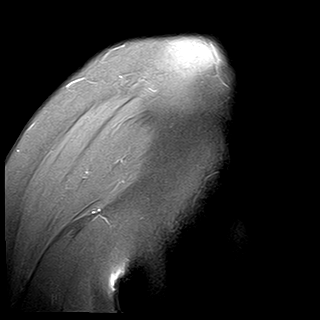

[Series 13: T1 · oblique · right · 4.0mm · 0.36mm/px · 5 of 22 slices shown (2 of 2)]
[im 1/22]
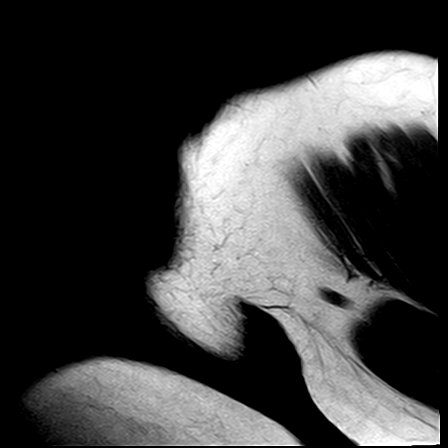
[im 6/22]
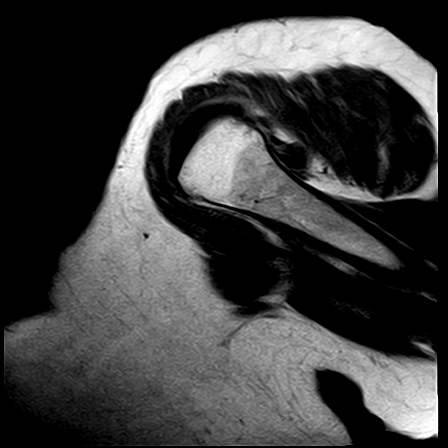
[im 11/22]
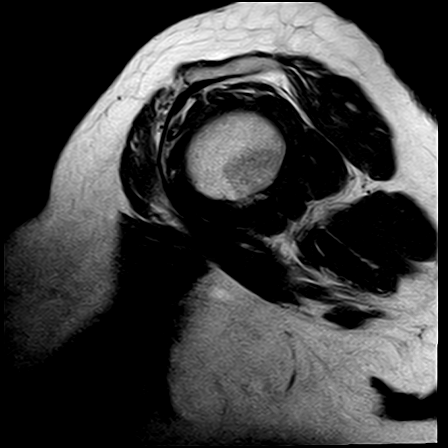
[im 16/22]
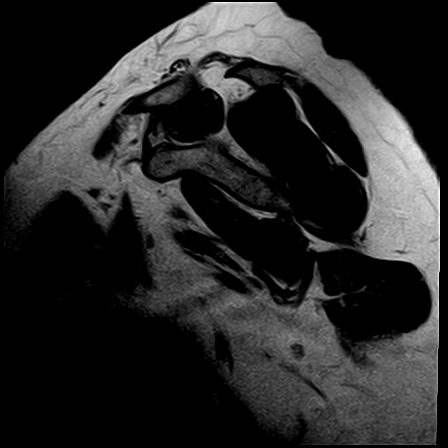
[im 22/22]
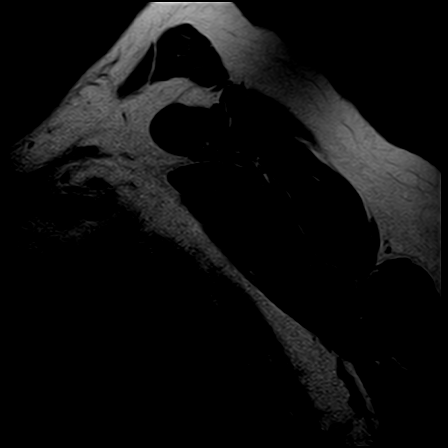

[29 of 40 positions shown; findings below may reference images not displayed]

FINDINGS: There is mild supraspinatus tendinopathy. Infraspinatus, teres minor and subscapularis muscles and tendons are within normal limits in morphology and signal intensity. Long head of biceps tendon is well seated within the intertubercular groove and attaches normally to the biceps anchor. Superior labrum is intact. Glenohumeral articular cartilage is well maintained.  There is mild-to-moderate acromioclavicular joint osteoarthritis. No significant fluid is noted within the subacromial/subdeltoid bursa. Muscle bulk and bone marrow signal intensity are normal. No mass is seen along the course of the suprascapular nerve, within the spinoglenoid notch or within the quadrilateral space.
IMPRESSION: 1. Mild supraspinatus tendinopathy.  

2. Mild-to-moderate acromioclavicular joint osteoarthritis.

## 2022-02-18 ENCOUNTER — Encounter (INDEPENDENT_AMBULATORY_CARE_PROVIDER_SITE_OTHER): Payer: Self-pay | Admitting: OTOLARYNGOLOGY

## 2022-02-18 ENCOUNTER — Ambulatory Visit (INDEPENDENT_AMBULATORY_CARE_PROVIDER_SITE_OTHER): Payer: Managed Care, Other (non HMO) | Admitting: Surgery

## 2022-02-18 ENCOUNTER — Encounter (INDEPENDENT_AMBULATORY_CARE_PROVIDER_SITE_OTHER): Payer: Self-pay | Admitting: Surgery

## 2022-02-18 ENCOUNTER — Other Ambulatory Visit: Payer: Self-pay

## 2022-02-18 VITALS — BP 135/63 | HR 65 | Temp 95.9°F | Resp 18 | Ht 61.0 in | Wt 157.0 lb

## 2022-02-18 DIAGNOSIS — K219 Gastro-esophageal reflux disease without esophagitis: Secondary | ICD-10-CM

## 2022-02-18 DIAGNOSIS — Z1211 Encounter for screening for malignant neoplasm of colon: Secondary | ICD-10-CM

## 2022-02-19 ENCOUNTER — Encounter (INDEPENDENT_AMBULATORY_CARE_PROVIDER_SITE_OTHER): Payer: Self-pay | Admitting: OTOLARYNGOLOGY

## 2022-02-19 ENCOUNTER — Other Ambulatory Visit (INDEPENDENT_AMBULATORY_CARE_PROVIDER_SITE_OTHER): Payer: Self-pay | Admitting: Surgery

## 2022-02-19 MED ORDER — SUTAB 1.479-0.188-0.225 GRAM TABLET
ORAL_TABLET | ORAL | 0 refills | Status: DC
Start: 2022-02-19 — End: 2022-06-21

## 2022-02-21 NOTE — H&P (Signed)
GENERAL SURGERY, [facility]  [address]  [place] [address]    History and Physical     Name: Angelica Thompson MRN:  [id number]   Date: 02/18/2022 Age: 66 y.o.            Reason for Visit: Colonoscopy and EGD (Eval for EGD due to GERD and colon screen)    History of Present Illness  Angelica Thompson presents today for EGD colonoscopy because of GERD and screening colonoscopy.      Negative diabetes  Negative blood thinner   negative family history of colon cancer      Review of the result(s) of each unique test:  Patient underwent diagnostic testing ( none ) prior to this dates visit.  I have personally reviewed the results and that serves as a component of the medical decision making for this encounter       Review of prior external note(s) from each unique source:  Patients referral to this office including a recent assessment by the referring provider.  This was reviewed by me for this unique office visit for the indication and intent of the referral as well as any pertinent medical or surgical history relevant to the patients independent evaluation by me today.      Patient History  Past Medical History:   Diagnosis Date   . Anemia     iron def   . B12 deficiency    . Esophageal reflux    . Hypertension    . Sleep apnea    . Vitamin D deficiency          Past Surgical History:   Procedure Laterality Date   . CERVICAL SPINE SURGERY     . ELBOW SURGERY      3 elbow surgeries   . HX BREAST REDUCTION     . HX CHOLECYSTECTOMY     . HX GASTRIC BYPASS     . HX REFRACTIVE SURGERY Right     wrist   . INCONTINENCE SURGERY     . ORTHOPEDIC SURGERY      right shoulder surgery and left shoulder surgery   . SALPINGECTOMY           Current Outpatient Medications   Medication Sig   . ergocalciferol, vitamin D2, (DRISDOL) 1,250 mcg (50,000 unit) Oral Capsule    . esomeprazole magnesium (NEXIUM) 40 mg Oral Capsule, Delayed Release(E.C.)    . ezetimibe (ZETIA) 10 mg Oral Tablet    . famotidine  (PEPCID) 40 mg Oral Tablet    . fenofibric acid (TRILIPIX) 135 mg Oral Capsule, Delayed Release(E.C.)    . Lactobac no.41/Bifidobact no.7 (PROBIOTIC-10 ORAL) Take by mouth   . multivitamin with iron Oral Tablet Take 1 Tablet by mouth Once a day   . propranoloL (INDERAL) 10 mg Oral Tablet    . sod sulf-pot chloride-mag sulf (SUTAB) 1.479-0.188- 0.225 gram Oral Tablet Take 12 pills at 10:00am to 10:30am. Take 12 pills at 6:00pm to 6:30pm. Drink plenty of water all day     Allergies   Allergen Reactions   . Sulfa (Sulfonamides)      Family Medical History:     Problem Relation (Age of Onset)    Brain cancer Sister    Leukemia Mother    Lung Cancer Sister, Brother    Throat cancer Mother          Social History     Tobacco Use   . Smoking status: Never   .  Smokeless tobacco: Never   Vaping Use   . Vaping Use: Never used   Substance Use Topics   . Alcohol use: Not Currently   . Drug use: Not Currently            Physical Examination:  Vitals:    02/18/22 1624   BP: 135/63   Pulse: 65   Resp: 18   Temp: 35.5 C (95.9 F)   SpO2: 93%   Weight: 71.2 kg (157 lb)   Height: 1.549 m (5\' 1" )   BMI: 29.73        General: appropriate for age. in no acute distress.    Vital signs are present above and have been reviewed by me     HEENT: Atraumatic, Normocephalic. PERRLA. EOMI. Nose clear. Throat clear    Lungs: Nonlabored breathing with symmetric expansion. Clear to auscultation bilaterally    Heart:Regular wth respect to rate and rythmn.    Abdomen:Soft. Nontender. Nondistended and benign    Extremities: Grossly normal. No major deformities     Neuro:  Grossly normal motor and sensory function    Psychiatric: Alert and oriented to person, place, and time. affect appropriate      Assessment and Plan  GERD and screening colonoscopy    EGD and colonoscopy scheduled for April 16, 2022 at 11:00 a.m.      Follow Up:  No follow-ups on file.      ICD-10-CM    1. Encounter for screening colonoscopy  Z12.11       2. Gastroesophageal reflux  disease without esophagitis  K21.9           Keyontae Huckeby B Amare Bail, MD ,MBA,FACS    I appreciate the opportunity to be involved in the care of your patients.  If you have any questions or concerns regarding this encounter, please do not hesitate to contact me at your convenience.      This note may have been partially generated using MModal Fluency Direct system, and there may be some incorrect words, spellings, and punctuation that were not noted in checking the note before saving, though effort was made to avoid such errors.

## 2022-03-24 ENCOUNTER — Other Ambulatory Visit (HOSPITAL_COMMUNITY): Payer: Self-pay | Admitting: Family Medicine

## 2022-03-24 DIAGNOSIS — M546 Pain in thoracic spine: Secondary | ICD-10-CM

## 2022-04-08 ENCOUNTER — Other Ambulatory Visit: Payer: Self-pay

## 2022-04-08 ENCOUNTER — Ambulatory Visit (HOSPITAL_COMMUNITY)
Admission: RE | Admit: 2022-04-08 | Discharge: 2022-04-08 | Disposition: A | Discharge status: Still In Hospital | Payer: Managed Care, Other (non HMO) | Source: Ambulatory Visit | Attending: PHYSICIAN ASSISTANT | Admitting: PHYSICIAN ASSISTANT

## 2022-04-08 DIAGNOSIS — M546 Pain in thoracic spine: Secondary | ICD-10-CM

## 2022-04-08 NOTE — PT Evaluation (Signed)
[facility]  Outpatient Physical Therapy  [address]  [place], [address]  [phone]  (Fax) [phone]       Physical Therapy Upper Extremity Evaluation    Date: 04/08/2022  Patient's Name: Angelica Thompson  Date of Birth: July 12, 1956    Referring diagnosis: pain in thoracic spine              SUBJECTIVE  Chief Complaint/MOI: Patient reports thoracic pain since mid December 2022.  Patient denies specific MOI, gradual onset.  Patient relates the spine itself doesn't hurt, more across both sides of the middle of the back.  Denies any pain down the arms.  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  Denies pain in the low back or BLE.  History of cervical fusion surgery around C5 level in 2007.    Past Medical History/Precautions:    Past Medical History:   Diagnosis Date   . Anemia     iron def   . B12 deficiency    . Esophageal reflux    . Hypertension    . Sleep apnea    . Vitamin D deficiency          Past Surgical History:   Procedure Laterality Date   . CERVICAL SPINE SURGERY     . ELBOW SURGERY      3 elbow surgeries   . HX BREAST REDUCTION     . HX CHOLECYSTECTOMY     . HX GASTRIC BYPASS     . HX REFRACTIVE SURGERY Right     wrist   . INCONTINENCE SURGERY     . ORTHOPEDIC SURGERY      right shoulder surgery and left shoulder surgery   . SALPINGECTOMY             Previous episodes/treatments: Injection but did not help    Medications for this problem: Tylenol    Diagnostic tests: x-ray - no fractures or findings.    Patient goals: REDUCE PAIN and NORMALIZE FUNCTION    Occupation: Doctor, hospital    Next MD visit: TBD    Pain location: Middle of the back across both sides                     Pain description: DULL and debilitating    Pain frequency:  CONTINUOUS    Pain rating: Now 7   Best 4   Worst 10    Pain increases with: Typing at computer, driving for work           decreases with : COLD and  MEDICATION    Weakness: Denies    Sleep affected: Denies    Subjective Functional Reports:    Lifting: WFL    Patient-Specific Functional Scale:  Problem Score   1. Driving 1   2. Typing at computer desk 1   3. House ADLs - vacuuming and changing sheets 1   Total 1     OBJECTIVE      Posture: FORWARD HEAD and scapular elevation    Cervical screening: WFL    Shoulder AROM  140 degrees of flexion, WNL abduction and ER and IR    ROM comment: decreased bilateral shoulder flexion due to significant decreased thoracic mobility     Strength (defined as __/5 based on 0/5 - 5/5 grading system)  BUE: 4-/5 throughout BUE    Strength comment: prone ITY 3+/5 bilaterally  Palpation: Significant TTP at the T4-T8 segments and transverse processess     Joint mobility: decreased thoracic mobility with PA glides from T2-T8    Open-Books: 60% normal AROM   Prone BUE "Y" flexion (superman position): unable    Treatment provided:REVIEW OF POC AND GOALS WITH PATIENT, ALL QUESTIONS ANSWERED, PATIENT EDUCATION and THERAPEUTIC EXERCISE    Exercises  - Seated Thoracic Lumbar Extension  - 3 x daily - 7 x weekly - 1 sets - 12 reps  - Scapular Retraction with Resistance  - 1 x daily - 7 x weekly - 3 sets - 15 reps  - Standing Shoulder Horizontal Abduction with Resistance  - 1 x daily - 7 x weekly - 3 sets - 12 reps  - Sidelying Open Book Thoracic Lumbar Rotation and Extension  - 1 x daily - 7 x weekly - 2 sets - 10 reps          ASSESSMENT    66 y.o. F presents to outpatient PT referred for thoracic pain.  Patient demonstrates deficits in thoracic mobility, postural strength, decreased UE ROM.  She will benefit from outpatient PT to address the above deficits to reach the highest functional level possible.    Patient tolerated evaluation and treatment well.  She demonstrated and verbalized understanding of HEP and patient education.     Rehab potential: GOOD    STGs 3 Weeks:  1.  Patient will be independent (with PRN use of HEP handout) with  progressive HEP to maximize gains from PT.  2.  Patient will report less than max pain of 6/10 to aid with participation in physical therapy.    LTGs 6 Weeks:  1.  Patient will improve functional ability with Patient Specific Functional Scale score of at least 8.  2.  Patient will demonstrate improved B shoulder AROM to at least 165 degrees to aid in ability to perform ADLs independently.  3.  Patient will demonstrate improved prone shoulder strength to at least 4+/5 throughout to aid in return to previous level of function.  4.  Patient will demonstrate prone BUE superman posture holds for 3" for 10 repetitions to aid in return to work duties.          PLAN  Patient will attend 2 times per week x 6 weeks. Therapy may include, but is not limited to THERAPEUTIC EXERCISES, MYOFASCIAL/JOINT MOBILIZATION, POSTURE/BODY MECHANICS, ERGONOMIC TRAINING, TRANSFER/GAIT TRAINING, HOME INSTRUCTIONS, HEAT/COLD, ULTRASOUND, ELECTRICAL STIMULATION, KINESIOTAPE and NEURO RE-EDUCATIOIN    At next visit: Initiate UBE retro warmup followed by thoracic mobility exercises at the door, sidelying, and seated.  Scapular/postural strengthening in standing and prone, thoracic joint mobilizations as tolerated     Evaluation complexity:   Personal factors impacting POC: FREQUENT OR CHRONIC PAIN and OCCUPATIONAL ADLS (IE HEAVY LIFTING, REPETITIVE TASKS, LONG HOURS)   Co-morbidities impacting POC: PREVIOUS SURGERIES  Complexity of physical exam: INCLUDING MUSCULOSKELETAL SYSTEM (POSTURE, ROM, STRENGTH, HEIGHT/WEIGHT), INCLUDING NEUROMUSCULAR EXAM (BALANCE, GAIT, LOCOMOTION, MOBILITY) and INCLUDING ACTIVITY/MOBILITY RESTRICTIONS   Clinical Presentation: STABLE   Evaluation Complexity: LOW-HISTORY 0, EXAMINATION 1-2, STABLE PRESENTATION     Intervention minutes: EVALUATION 32 minutes and THERAPEUTIC EXERCISE 8 minutes    Angelica Thompson, PT  04/08/2022, 13:18    Start of Service: _________          Certification:    From:______  Through:_________    I  certify the need for these services furnished under this plan of treatment and while under my care.  Referring Provider Signature: _______________     Date : _____________________

## 2022-04-11 ENCOUNTER — Other Ambulatory Visit: Payer: Self-pay

## 2022-04-11 ENCOUNTER — Ambulatory Visit (HOSPITAL_COMMUNITY)
Admission: RE | Admit: 2022-04-11 | Discharge: 2022-04-11 | Disposition: A | Discharge status: Still In Hospital | Payer: Managed Care, Other (non HMO) | Source: Ambulatory Visit | Attending: PHYSICIAN ASSISTANT | Admitting: PHYSICIAN ASSISTANT

## 2022-04-11 NOTE — PT Treatment (Signed)
[facility]  Outpatient Physical Therapy  [place], [address]  [phone]  [phone]    Physical Therapy Treatment Note    Date: 04/11/2022  Patient's Name: ALAUNI Thompson  Date of Birth: 04-Aug-1956            Visit #2/POC:7/18  Authorization:[employer]      Evaluating Physical Therapist: Mateo Flow, PT, DPT  PT diagnosis/Reason for Referral: thoracic spine pain  Next Scheduled Physician Appointment: TBD          Subjective: Patient reports she did feel like the exercises from evaluation helped, but got very sore that night.  States she wasn't able to complete exercises yesterday due to work schedule.  Reports 7/10 thoracic pain today.    Objective: Therex as followed for postural strengthening and thoracic mobility      EXERCISE/ACTIVITY NAME REPETITIONS RESISTANCE COMPLETED THIS DOS   UBE retro   5 minutes - Y   SA foam roll up wall   x15 - Y   T-band row  T-band low row   x20  x20 Blue  Green Y   Horizontal abduction   x15 green Y   Seated thoracic rotation   x15 each PB Y   Open Book   x15 each - Y   MFR: ball rolling            MHP 10 minutes  Y         Assessment: Patient performed all postural strengthening and mobility exercises well with reported fatigue and thoracic stretch.  She was unable to tolerate AP prone thoracic joint mobilizations due to significant pain behaviors to even light touch.  Ended with MHP to the t-spine to reduce pain and  Guarding.  Therex did include brief MFR for light ball rolling to the t-spine PVMs for desensitization.    Plan: Continue to progress based on response.    Total Session Time 40, Timed code minutes 30 and Untimed code minutes 10  THERAPEUTIC EXERCISE 30 minutes      Laiana Fratus, PT  04/11/2022, 15:40

## 2022-04-14 ENCOUNTER — Other Ambulatory Visit: Payer: Self-pay

## 2022-04-14 ENCOUNTER — Ambulatory Visit (HOSPITAL_COMMUNITY)
Admission: RE | Admit: 2022-04-14 | Discharge: 2022-04-14 | Disposition: A | Discharge status: Still In Hospital | Payer: Managed Care, Other (non HMO) | Source: Ambulatory Visit | Attending: PHYSICIAN ASSISTANT | Admitting: PHYSICIAN ASSISTANT

## 2022-04-14 NOTE — PT Treatment (Signed)
[facility]  Outpatient Physical Therapy  [address]  [place], [address]  [phone]  (Fax) [phone]    Physical Therapy Treatment Note    Date: 04/14/2022  Patient's Name: Angelica Thompson  Date of Birth: 02-23-1956            Visit #3/POC:7/18  Authorization:[employer]      Evaluating Physical Therapist: Marcell Anger, PT, DPT  PT diagnosis/Reason for Referral: thoracic spine pain  Next Scheduled Physician Appointment: TBD          Subjective:Reports that her thoracic spine pain is 7/10. States that she had to wear a Lidocaine patch today. Notes that it was hurting worse yesterday. Notes that over the weekend she had "electrical impulses" shooting from mid thoracic spine out towards the L side.     Objective: Therex as followed for postural strengthening and thoracic mobility and postural strengthening. Ended with MH for pain control.      EXERCISE/ACTIVITY NAME REPETITIONS RESISTANCE COMPLETED THIS DOS   UBE retro   5 minutes - Y   SA foam roll up wall   x15 - Y   T-band row  T-band low row   x20  x20 Blue  Green Y   Horizontal abduction   x15 green Y   Seated thoracic rotation   x15 each PB Y   Open Book   x15 each - Y   MFR: ball rolling            MHP 10 minutes  Y         Assessment: Pt had fair tolerance for exercises with complaints of bilateral shoulder issues which made some exercises more difficult. States more electrical impulse feeling while on heat this time more on the R but did have some on L.      Plan: Continue to progress based on response.    Total Session Time 40, Timed code minutes 30 and Untimed code minutes 10  THERAPEUTIC EXERCISE 30 minutes      Geri Hepler, PTA

## 2022-04-16 ENCOUNTER — Inpatient Hospital Stay (HOSPITAL_COMMUNITY): Admit: 2022-04-16 | Payer: Managed Care, Other (non HMO) | Source: Ambulatory Visit | Admitting: Surgery

## 2022-04-16 ENCOUNTER — Encounter (HOSPITAL_COMMUNITY): Payer: Self-pay | Source: Ambulatory Visit

## 2022-04-16 SURGERY — GASTROSCOPY WITH BIOPSY
Anesthesia: General

## 2022-04-18 ENCOUNTER — Ambulatory Visit (HOSPITAL_COMMUNITY)
Admission: RE | Admit: 2022-04-18 | Discharge: 2022-04-18 | Disposition: A | Discharge status: Still In Hospital | Payer: Managed Care, Other (non HMO) | Source: Ambulatory Visit | Attending: PHYSICIAN ASSISTANT | Admitting: PHYSICIAN ASSISTANT

## 2022-04-18 ENCOUNTER — Other Ambulatory Visit: Payer: Self-pay

## 2022-04-18 NOTE — PT Treatment (Signed)
[facility]  Outpatient Physical Therapy  [address]  [place], [address]  [phone]  (Fax) [phone]    Physical Therapy Treatment Note    Date: 04/18/2022  Patient's Name: Angelica Thompson  Date of Birth: [birth year]            Visit #3/POC:7/18  Authorization:[employer]      Evaluating Physical Therapist: Marcell Anger, PT, DPT  PT diagnosis/Reason for Referral: thoracic spine pain  Next Scheduled Physician Appointment: TBD          Subjective: Patient relates she is having more pain in low back. She has been travelling a lot for work and states that aggravates upper and lower back. She made appointment with pain management doctor for next Wednesday. She rates thoracic pain 8/10. She has lidocaine patch on right now. She rates low back pain 9/10.     Objective: Therex as followed for postural strengthening and thoracic mobility and postural strengthening. Ended with MH for pain control.      EXERCISE/ACTIVITY NAME REPETITIONS RESISTANCE COMPLETED THIS DOS   UBE retro/forward alternating   5 minutes - Y   SA foam roll up wall   x15 - Y   T-band row  T-band low row   x20  x20 Blue  Green N   Horizontal abduction   x15 green N   Seated thoracic rotation   x15 each PB (pink) Y   Open Book   x15 each - N   MFR: bilateral thoracic paraspinals              Trigger point release right distal scapula    manual Y  Y   Gentle PA's T1-T12 Very light pressure  Y   MHP 10 minutes  Y         Assessment: Patient sits with rounded shoulders and forward head. Education was provided on posture and importance of making efforts to sit with better posture. Patient tolerated very light pressure with PA's to T1-T12 and trigger point release.     STGs 3 Weeks:  1.  Patient will be independent (with PRN use of HEP handout) with progressive HEP to maximize gains from PT.  2.  Patient will report less than max pain of 6/10 to aid with participation in physical therapy.    LTGs 6 Weeks:  1.   Patient will improve functional ability with Patient Specific Functional Scale score of at least 8.  2.  Patient will demonstrate improved B shoulder AROM to at least 165 degrees to aid in ability to perform ADLs independently.  3.  Patient will demonstrate improved prone shoulder strength to at least 4+/5 throughout to aid in return to previous level of function.  4.  Patient will demonstrate prone BUE superman posture holds for 3" for 10 repetitions to aid in return to work duties.    Plan: Continue to progress based on response.    Total Session Time 40, Timed code minutes 30 and Untimed code minutes 10  THERAPEUTIC EXERCISE 30 minutes    [employer], PTA

## 2022-04-21 ENCOUNTER — Ambulatory Visit (HOSPITAL_COMMUNITY): Payer: Self-pay

## 2022-04-22 IMAGING — US ABD LIMITED
1 series · 14 of 25 positions shown · non-contrast
Comparison: 08/17/2020.

﻿EXAM: MONIKIIKIIT PROFESSIONAL READ ABD U/S LMTD
INDICATION: Fatty liver.

[Series 1: abd limited · 14 of 41 slices shown]
[im 1/41]
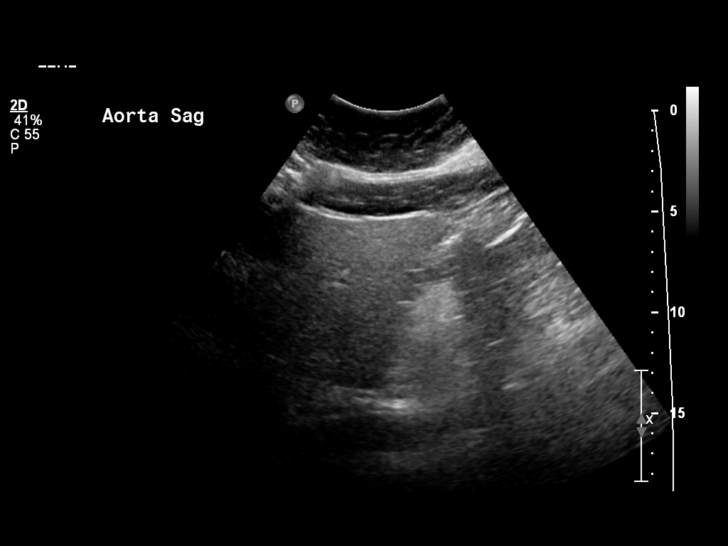
[im 4/41]
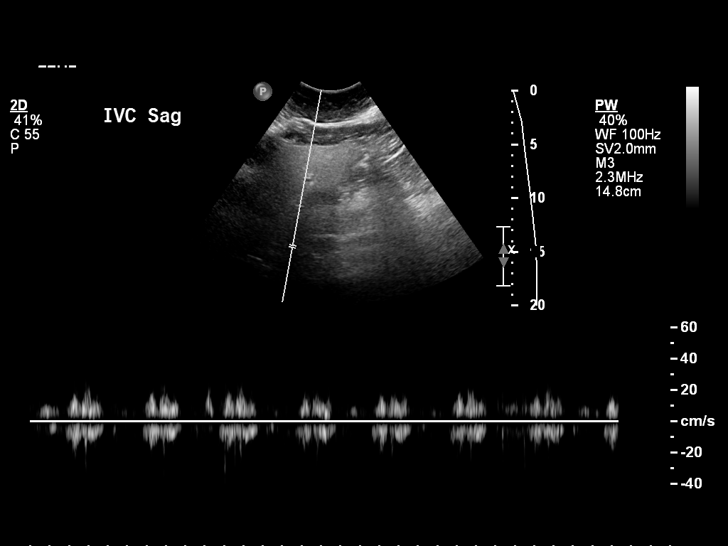
[im 7/41]
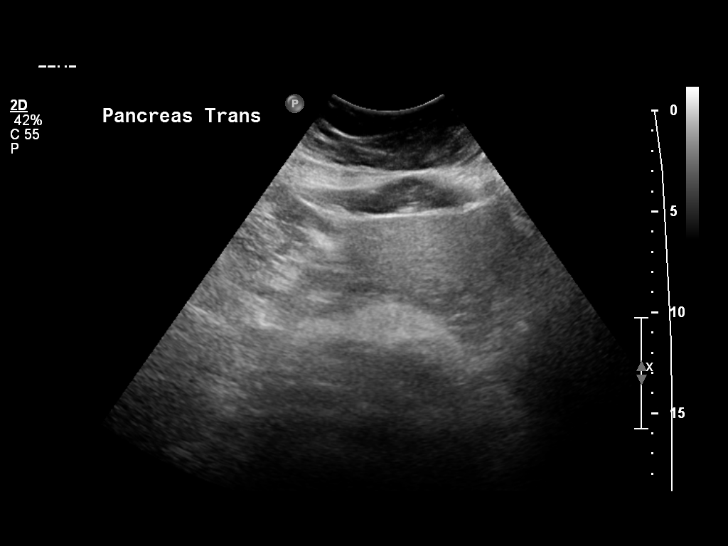
[im 11/41]
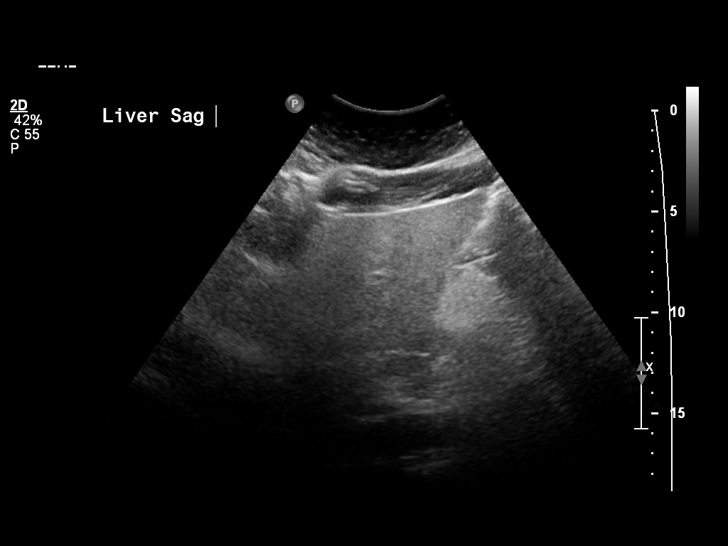
[im 14/41]
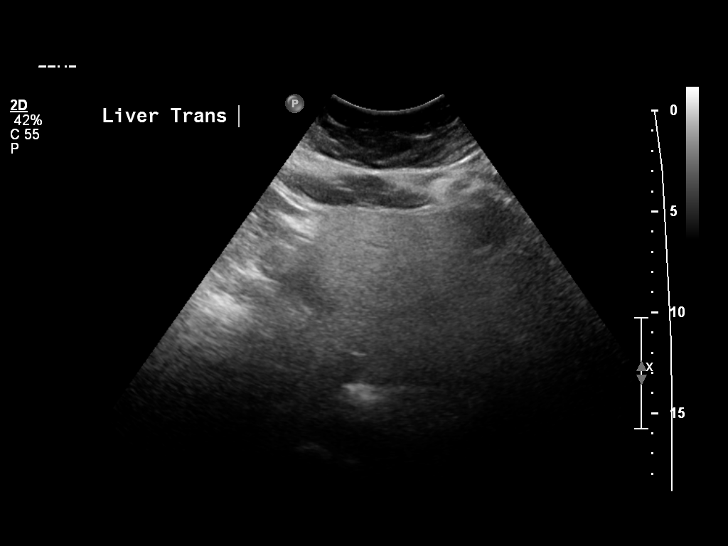
[im 16/41]
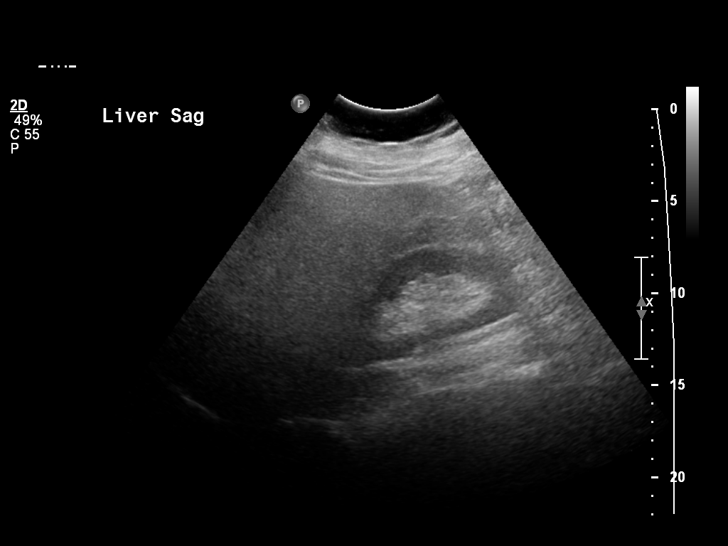
[im 19/41]
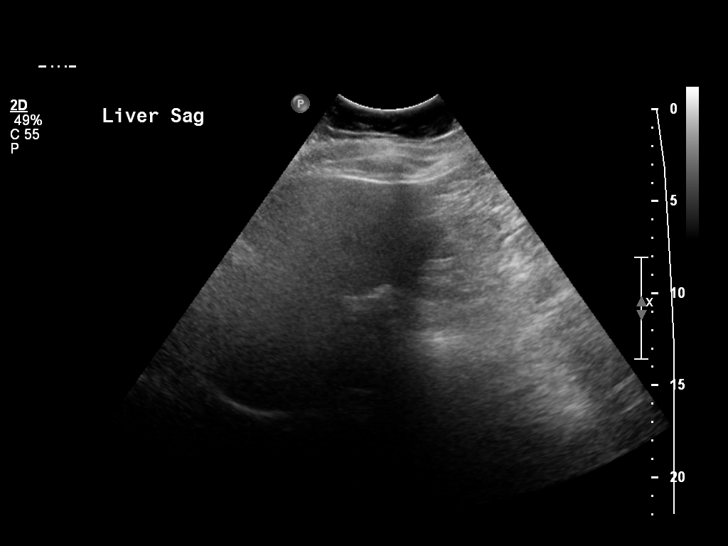
[im 22/41]
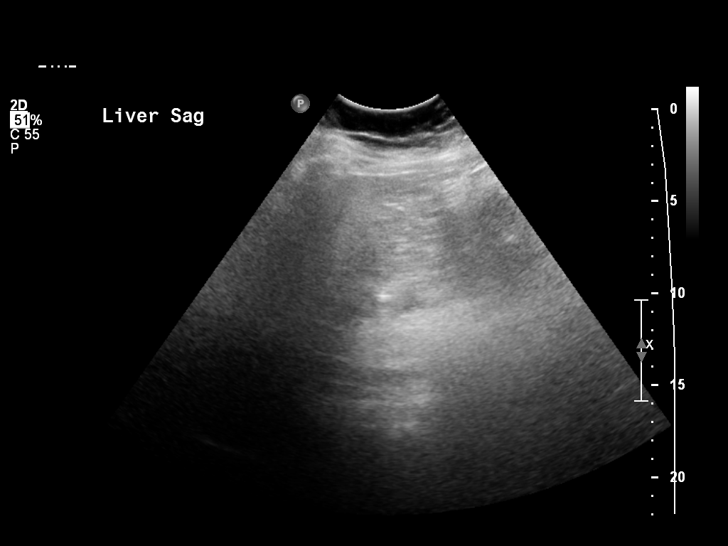
[im 26/41]
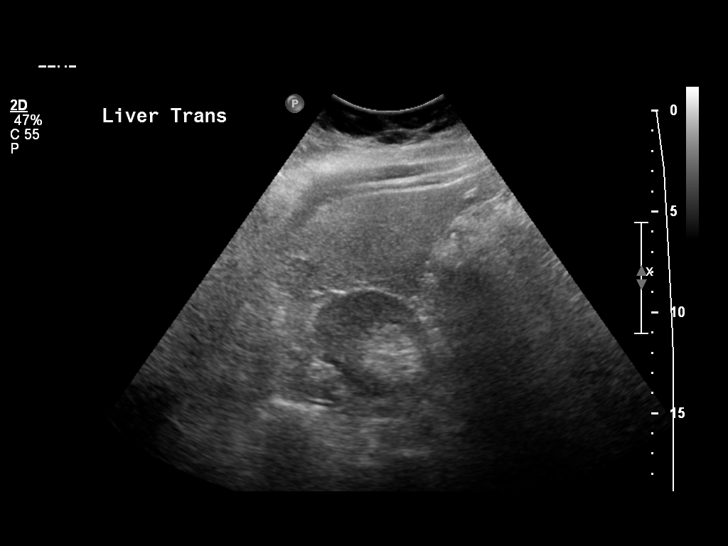
[im 27/41]
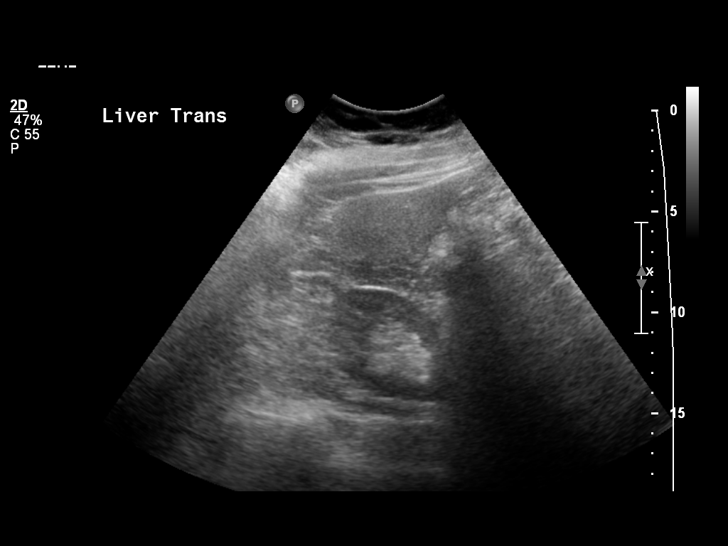
[im 31/41]
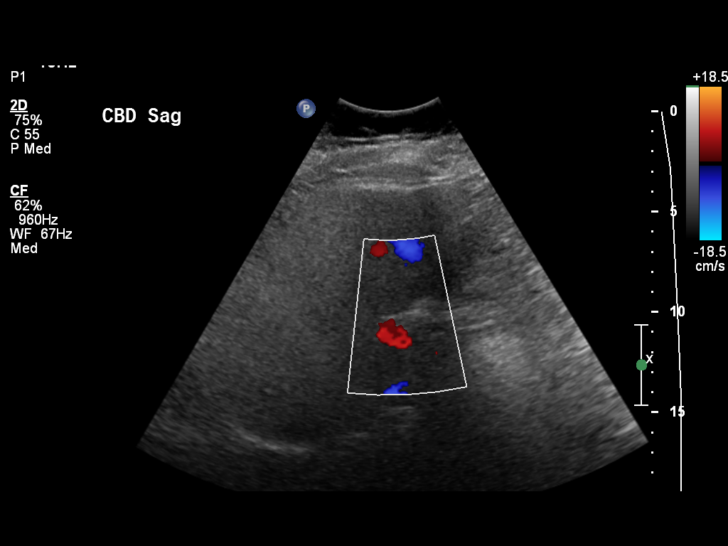
[im 34/41]
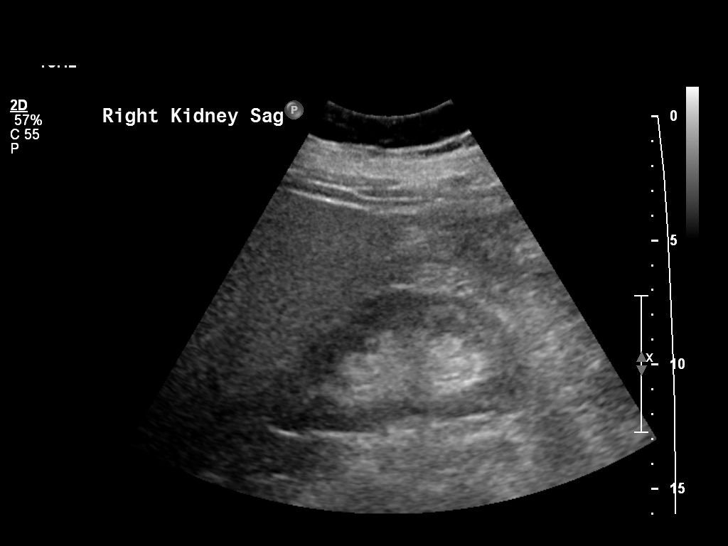
[im 37/41]
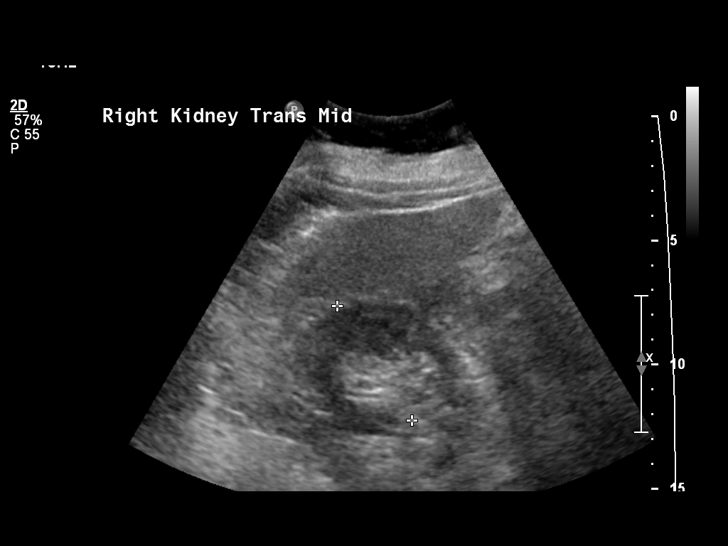
[im 41/41]
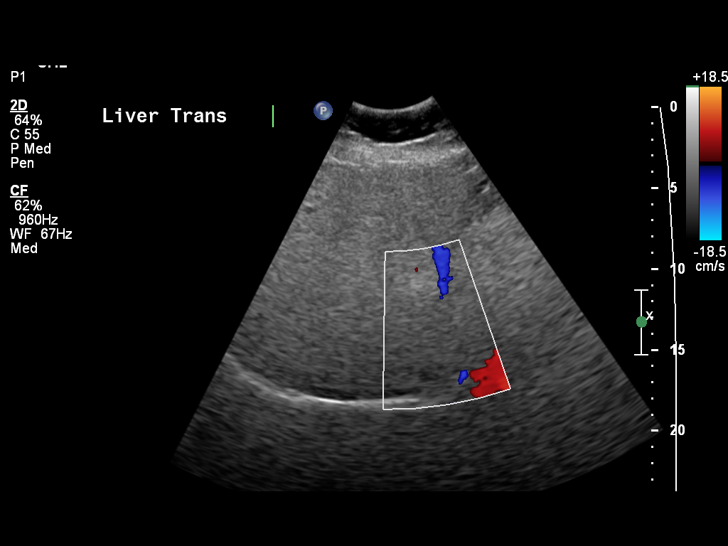

[14 of 25 positions shown; findings below may reference images not displayed]

FINDINGS: Liver is fatty and borderline enlarged measuring 17 cm in maximum sagittal dimension. Fatty infiltration limits evaluation for focal hepatic mass. There is no intra or extrahepatic biliary ductal dilation. Common bile duct measures 2.5 mm. Gallbladder is surgically absent. Pancreas is incompletely visualized due to artifact from overlying bowel gas. Right kidney measures 11 cm and is unremarkable. 

Visualized abdominal aorta is without aneurysmal dilatation. IVC is normal. Portal vein measures 6 mm in diameter and demonstrates appropriate hepatopetal direction of flow. Hepatic veins are also patent. There is no ascites.
IMPRESSION: 1. Fatty and borderline enlarged liver.

2. Prior cholecystectomy.

3. Pancreas incompletely visualized due to artifact from overlying bowel gas.

## 2022-04-23 ENCOUNTER — Ambulatory Visit
Admission: RE | Admit: 2022-04-23 | Discharge: 2022-04-23 | Disposition: A | Discharge status: Still In Hospital | Payer: Managed Care, Other (non HMO) | Source: Ambulatory Visit | Attending: PHYSICIAN ASSISTANT | Admitting: PHYSICIAN ASSISTANT

## 2022-04-23 ENCOUNTER — Other Ambulatory Visit: Payer: Self-pay

## 2022-04-23 ENCOUNTER — Encounter (INDEPENDENT_AMBULATORY_CARE_PROVIDER_SITE_OTHER): Payer: Self-pay | Admitting: OTOLARYNGOLOGY

## 2022-04-23 NOTE — PT Treatment (Addendum)
[facility]  Outpatient Physical Therapy  [address]  [place], [address]  [phone]  (Fax) [phone]    Physical Therapy Treatment Note    Date: 04/23/2022  Patient's Name: Angelica Thompson  Date of Birth: 06/09/1956        Visit #5/POC:7/18  Authorization:[employer]        Evaluating Physical Therapist: Marcell Anger, PT, DPT  PT diagnosis/Reason for Referral: thoracic spine pain  Next Scheduled Physician Appointment: 05/21/22      Patient has not followed up with PT and will be discharged at this time.  No formal reassessment has been made due to patient attending 5 sessions of the 12 total POC with 3 cancels and 0 no-shows.  Interventions included therex, joint mobilizations, MFR, and modalities for the treatment thoracic spine pain.  Date of service: 04/08/22 - 04/23/22.       Subjective: Patient relates worsening of mid back symptoms.  Followed up with a pain specialist who is ordering blood work to rule out pancreatitis for possible referral pain to the middle back.  States the pain specialist did give an injection in the right hip for piriformis which did help "about 80%" but still has pain into the right heel.  Relates before this injection, she was "prety much down."  Patient reports 10/10 middle back pain today.  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       Objective: Joint mobilizations grade 1-3 thoracic spine PA        EXERCISE/ACTIVITY NAME REPETITIONS RESISTANCE COMPLETED THIS DOS   UBE retro/forward alternating    5 minutes - N   SA foam roll up wall    x15 - N   T-band row  T-band low row    x20  x20 Blue  Green N   Horizontal abduction    x15 green N   Seated thoracic rotation    x15 each PB (pink) N   Open Book    x15 each - N   MFR: bilateral thoracic paraspinals              Trigger point release right distal scapula      manual N   Gentle PA's T1-T12 Very light pressure   Y   MHP 10 minutes   N            Assessment:  Patient exhibits significant pain behaviors to light touch and gentle pressures at the middle thoracic spine.  Unable to tolerate grade 3 mobilizations due to pain behaviors.  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     STGs 3 Weeks:  1.  Patient will be independent (with PRN use of HEP handout) with progressive HEP to maximize gains from PT.  2.  Patient will report less than max pain of 6/10 to aid with participation in physical therapy.     LTGs 6 Weeks:  1.  Patient will improve functional ability with Patient Specific Functional Scale score of at least 8.  2.  Patient will demonstrate improved B shoulder AROM to at least 165 degrees to aid in ability to perform ADLs independently.  3.  Patient will demonstrate improved prone shoulder strength to at least 4+/5 throughout to aid in return to previous level of function.  4.  Patient will demonstrate prone BUE superman posture holds for 3" for 10 repetitions to aid in return to work duties.     Plan:  Continue to progress based on response.      Total Session Time 20 and Timed code minutes 20  JOINT MOBILIZATION/MFR 20 minutes      Liane Tribbey, PT  04/23/2022, 16:31

## 2022-04-25 ENCOUNTER — Ambulatory Visit (HOSPITAL_COMMUNITY): Payer: Self-pay

## 2022-04-29 ENCOUNTER — Ambulatory Visit (HOSPITAL_COMMUNITY): Payer: Self-pay

## 2022-05-01 ENCOUNTER — Ambulatory Visit (HOSPITAL_COMMUNITY): Payer: Self-pay

## 2022-05-15 ENCOUNTER — Encounter (INDEPENDENT_AMBULATORY_CARE_PROVIDER_SITE_OTHER): Payer: Self-pay | Admitting: Surgery

## 2022-06-12 ENCOUNTER — Other Ambulatory Visit: Payer: Self-pay

## 2022-06-12 ENCOUNTER — Emergency Department
Admission: EM | Admit: 2022-06-12 | Discharge: 2022-06-12 | Disposition: A | Discharge status: Home / Self Care | Payer: BC Managed Care – PPO | Attending: Family | Admitting: Family

## 2022-06-12 ENCOUNTER — Emergency Department (EMERGENCY_DEPARTMENT_HOSPITAL): Payer: BC Managed Care – PPO

## 2022-06-12 DIAGNOSIS — W19XXXA Unspecified fall, initial encounter: Secondary | ICD-10-CM | POA: Insufficient documentation

## 2022-06-12 DIAGNOSIS — M436 Torticollis: Secondary | ICD-10-CM

## 2022-06-12 DIAGNOSIS — S134XXA Sprain of ligaments of cervical spine, initial encounter: Secondary | ICD-10-CM | POA: Insufficient documentation

## 2022-06-12 DIAGNOSIS — M1990 Unspecified osteoarthritis, unspecified site: Secondary | ICD-10-CM | POA: Insufficient documentation

## 2022-06-12 DIAGNOSIS — Z981 Arthrodesis status: Secondary | ICD-10-CM | POA: Insufficient documentation

## 2022-06-12 DIAGNOSIS — S139XXA Sprain of joints and ligaments of unspecified parts of neck, initial encounter: Secondary | ICD-10-CM

## 2022-06-12 DIAGNOSIS — M47812 Spondylosis without myelopathy or radiculopathy, cervical region: Secondary | ICD-10-CM | POA: Insufficient documentation

## 2022-06-12 DIAGNOSIS — S43402A Unspecified sprain of left shoulder joint, initial encounter: Secondary | ICD-10-CM | POA: Insufficient documentation

## 2022-06-12 DIAGNOSIS — Z87828 Personal history of other (healed) physical injury and trauma: Secondary | ICD-10-CM | POA: Insufficient documentation

## 2022-06-12 DIAGNOSIS — M25512 Pain in left shoulder: Secondary | ICD-10-CM

## 2022-06-12 MED ORDER — MELOXICAM 7.5 MG TABLET
7.5000 mg | ORAL_TABLET | Freq: Every day | ORAL | 0 refills | Status: DC
Start: 2022-06-12 — End: 2022-12-21

## 2022-06-12 MED ORDER — KETOROLAC 60 MG/2 ML INTRAMUSCULAR SOLUTION
INTRAMUSCULAR | Status: AC
Start: 2022-06-12 — End: 2022-06-12
  Filled 2022-06-12: qty 2

## 2022-06-12 MED ORDER — KETOROLAC 60 MG/2 ML INTRAMUSCULAR SOLUTION
60.0000 mg | INTRAMUSCULAR | Status: AC
Start: 2022-06-12 — End: 2022-06-12
  Administered 2022-06-12: 60 mg via INTRAMUSCULAR

## 2022-06-12 NOTE — ED Triage Notes (Signed)
Slipped and fell this am on the porch and injured her left arm. States hit the front of her head on the right side. ROM intact.

## 2022-06-12 NOTE — ED Nurses Note (Signed)
Sling applied .

## 2022-06-12 NOTE — ED Provider Notes (Signed)
[facility]  ED Primary Provider Note  History of Present Illness   Chief Complaint   Patient presents with    Shoulder Injury    Fall     Arrival: The patient arrived by Car    Angelica Thompson is a 66 y.o. female who had concerns including Shoulder Injury and Fall. Pt states she slid on deck, hit on rt side but all pain on left. Hx of ligament injury in past. Rt neck sore no mid line tenderness left shoulder limited ROM no deformity    Review of Systems   Constitutional: No fever, chills or weakness   Skin: No rash or diaphoresis  HENT: No headaches, or congestion  Eyes: No vision changes or photophobia   Cardio: No chest pain, palpitations or leg swelling   Respiratory: No cough, wheezing or SOB  GI:  No nausea, vomiting or stool changes  GU:  No dysuria, hematuria, or increased frequency  MSK: No muscle aches, + pain and injury joint no  back pain  Neuro: No seizures, LOC, numbness, tingling, or focal weakness  Psychiatric: No depression, SI or substance abuse  All other systems reviewed and are negative.    Historical Data   History Reviewed This Encounter: all noted and reviewed  Physical Exam   ED Triage Vitals [06/12/22 1146]   BP (Non-Invasive) 114/73   Heart Rate 77   Respiratory Rate 15   Temperature 36.1 C (96.9 F)   SpO2 99 %   Weight 68 kg (150 lb)   Height 1.549 m (5\' 1" )     }  Constitutional:  66 y.o. female who appears in no distress. Normal color, no cyanosis.   HENT:   Head: Normocephalic and atraumatic.   Mouth/Throat: Oropharynx is clear and moist.   Eyes: EOMI, PERRL   Neck: Trachea midline. Neck supple.  Cardiovascular: RRR, No murmurs, rubs or gallops. Intact distal pulses.  Pulmonary/Chest: BS equal bilaterally. No respiratory distress. No wheezes, rales or chest tenderness.   Abdominal: Bowel sounds present and normal. Abdomen soft, no tenderness, no rebound and no guarding.  Back: No midline spinal tenderness, no paraspinal tenderness, no CVA tenderness.            Musculoskeletal: mild left deltoid area edema, tenderness no deformity. Rt paraspinal tenderness to cervical area.   Skin: warm and dry. No rash, erythema, pallor or cyanosis  Psychiatric: normal mood and affect. Behavior is normal.   Neurological: Patient keenly alert and responsive, easily able to raise eyebrows, facial muscles/expressions symmetric, speaking in fluent sentences, moving all extremities equally and fully, normal gait  Patient Data   Labs Ordered/Reviewed - No data to display  XR SHOULDER LEFT   Final Result by Edi, Radresults In (08/10 1230)   NO ACUTE FRACTURE OR DISLOCATION.                Radiologist location ID: [id number]         XR CERVICAL SPINE COMPLETE (4 OR MORE VIEWS)   Final Result by Edi, Radresults In (08/10 1237)   1. No acute osseous abnormality identified radiographically.   2. Postsurgical changes related to C6-C7 anterior cervical fusion with hardware.   3. C5-C6 discogenic degenerative changes with multilevel degenerative arthritic changes.               Radiologist location ID: [id number]           Medical Decision Making   Diff dx fx sprain ligament  injury compression fx, sprain          Medications Administered in the ED   ketorolac (TORADOL) 60mg /2 mL IM injection (60 mg IntraMUSCULAR Given 06/12/22 1230)     Clinical Impression   Sprain of left shoulder, unspecified shoulder sprain type, initial encounter (Primary)   Cervical sprain, initial encounter   Osteoarthritis, unspecified osteoarthritis type, unspecified site       Disposition: Discharged

## 2022-06-12 NOTE — ED Nurses Note (Signed)
Patient seen and dc'd by the provider.

## 2022-06-16 NOTE — H&P (Signed)
GENERAL SURGERY, [facility]  [address]  [place] [address]       Name: Angelica Thompson MRN:  [id number]   Date: 06/17/2022 Age: 66 y.o. 1956/07/19      PCP: Mariah Milling, DO     Subjective  Angelica Thompson is a 66 y.o. year old female who presents for screening colonoscopy.  No current GI complaints except for GERD which is under good control. She takes Nexium and Pepcid qhs. 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  No constipation.  No abdominal pain.  No rectal bleeding.  No unexplained weight loss.    Patient's last colonoscopy was 2-3 years ago. She has no polyps nor is there any family history of colon cancer.    Family history of colon cancer:  none    Last colonoscopy:  2-3 years ago    Patient's referral to this office included a recent assessment by the referring provider.  This was reviewed by me for this unique office visit for the indication and intent of the referral as well as any pertinent medical or surgical history relevant to the patient's independent evaluation by me today.     Allergies   Allergen Reactions    Sulfa (Sulfonamides)       Current Outpatient Medications   Medication Sig    ergocalciferol, vitamin D2, (DRISDOL) 1,250 mcg (50,000 unit) Oral Capsule     esomeprazole magnesium (NEXIUM) 40 mg Oral Capsule, Delayed Release(E.C.)     ezetimibe (ZETIA) 10 mg Oral Tablet     famotidine (PEPCID) 40 mg Oral Tablet     fenofibric acid (TRILIPIX) 135 mg Oral Capsule, Delayed Release(E.C.)     Lactobac no.41/Bifidobact no.7 (PROBIOTIC-10 ORAL) Take by mouth    meloxicam (MOBIC) 7.5 mg Oral Tablet Take 1 Tablet (7.5 mg total) by mouth Once a day    multivitamin with iron Oral Tablet Take 1 Tablet by mouth Once a day    propranoloL (INDERAL) 10 mg Oral Tablet     sod sulf-pot chloride-mag sulf (SUTAB) 1.479-0.188- 0.225 gram Oral Tablet Take 12 pills at 10:00am to 10:30am. Take 12 pills at 6:00pm to 6:30pm. Drink plenty of water all day           Objective:     There were no vitals filed for this visit.     General: appropriate for age. in no acute distress.    Vital signs are present above and have been reviewed by me     HEENT: Atraumatic, Normocephalic.    Lungs: Nonlabored breathing with symmetric expansion    Heart:Regular wth respect to rate and rythmn.    Abdomen:Soft. Nontender. Nondistended     Psychiatric: Alert and oriented to person, place, and time. affect appropriate    Assessment/Plan    ICD-10-CM    1. Screening for malignant neoplasm of colon  Z12.11       2. Gastroesophageal reflux disease without esophagitis  K21.9            Because the patient has good control of her GERD and her last colonoscopy was 2-3 years ago, I would not recommend an EGD or colonoscopy.  Colonoscopy in 7 years (10 year screening )      [name] Rahul Malinak MD MBA FACS

## 2022-06-17 ENCOUNTER — Other Ambulatory Visit: Payer: Self-pay

## 2022-06-17 ENCOUNTER — Encounter (INDEPENDENT_AMBULATORY_CARE_PROVIDER_SITE_OTHER): Payer: Self-pay | Admitting: Surgery

## 2022-06-17 ENCOUNTER — Ambulatory Visit (INDEPENDENT_AMBULATORY_CARE_PROVIDER_SITE_OTHER): Payer: BC Managed Care – PPO | Admitting: Surgery

## 2022-06-17 VITALS — BP 118/71 | HR 83 | Temp 98.0°F | Wt 150.0 lb

## 2022-06-17 DIAGNOSIS — Z1211 Encounter for screening for malignant neoplasm of colon: Secondary | ICD-10-CM

## 2022-06-17 DIAGNOSIS — K219 Gastro-esophageal reflux disease without esophagitis: Secondary | ICD-10-CM

## 2022-06-19 ENCOUNTER — Telehealth (INDEPENDENT_AMBULATORY_CARE_PROVIDER_SITE_OTHER): Payer: Self-pay | Admitting: Surgery

## 2022-06-19 NOTE — Telephone Encounter (Signed)
Patient wanted to inform you that she has had Cdiff x 2 previously. Says she forgot to tell you on Tuesday. Wants to know if she would need to be scheduled for colonoscopy and EGD because of this?  Meriam Sprague, LPN  9/38/1829 93:71

## 2022-06-23 NOTE — Telephone Encounter (Signed)
Not sure. I will route to Dr. Donnal Debar.   Dr. Donnal Debar, Does patient need to be scheduled for EGD and colonoscopy?

## 2022-06-26 NOTE — Telephone Encounter (Signed)
Patient did not mention that she was having any problems. Only wanted to make you aware that she has history of Cdiff and if she would need a colonoscopy because of this.  Angelica Sprague, LPN  8/63/8177 11:65

## 2022-07-01 NOTE — Telephone Encounter (Signed)
Patient notified. Voiced understanding.   Meriam Sprague, LPN  4/62/8638 17:71

## 2022-08-28 ENCOUNTER — Other Ambulatory Visit (HOSPITAL_COMMUNITY): Payer: Self-pay | Admitting: Family Medicine

## 2022-08-28 DIAGNOSIS — M5416 Radiculopathy, lumbar region: Secondary | ICD-10-CM

## 2022-08-28 DIAGNOSIS — M549 Dorsalgia, unspecified: Secondary | ICD-10-CM

## 2022-09-09 ENCOUNTER — Other Ambulatory Visit: Payer: Self-pay

## 2022-09-09 ENCOUNTER — Inpatient Hospital Stay
Admission: RE | Admit: 2022-09-09 | Discharge: 2022-09-09 | Disposition: A | Discharge status: Home / Self Care | Payer: BC Managed Care – PPO | Source: Ambulatory Visit | Attending: Family Medicine | Admitting: Family Medicine

## 2022-09-09 ENCOUNTER — Other Ambulatory Visit (HOSPITAL_COMMUNITY): Payer: Self-pay | Admitting: Family Medicine

## 2022-09-09 DIAGNOSIS — M5416 Radiculopathy, lumbar region: Secondary | ICD-10-CM | POA: Insufficient documentation

## 2022-10-22 ENCOUNTER — Other Ambulatory Visit (HOSPITAL_COMMUNITY): Payer: Self-pay | Admitting: Family Medicine

## 2022-10-22 DIAGNOSIS — M5414 Radiculopathy, thoracic region: Secondary | ICD-10-CM

## 2022-10-22 DIAGNOSIS — M62838 Other muscle spasm: Secondary | ICD-10-CM

## 2022-10-30 ENCOUNTER — Other Ambulatory Visit: Payer: Self-pay

## 2022-10-30 ENCOUNTER — Inpatient Hospital Stay
Admission: RE | Admit: 2022-10-30 | Discharge: 2022-10-30 | Disposition: A | Discharge status: Home / Self Care | Payer: BC Managed Care – PPO | Source: Ambulatory Visit | Attending: Family Medicine | Admitting: Family Medicine

## 2022-10-30 ENCOUNTER — Inpatient Hospital Stay (HOSPITAL_COMMUNITY)
Admission: RE | Admit: 2022-10-30 | Discharge: 2022-10-30 | Disposition: A | Discharge status: Home / Self Care | Payer: BC Managed Care – PPO | Source: Ambulatory Visit | Attending: Family Medicine | Admitting: Family Medicine

## 2022-10-30 DIAGNOSIS — M62838 Other muscle spasm: Secondary | ICD-10-CM | POA: Insufficient documentation

## 2022-10-30 DIAGNOSIS — M5414 Radiculopathy, thoracic region: Secondary | ICD-10-CM | POA: Insufficient documentation

## 2022-12-16 ENCOUNTER — Encounter (INDEPENDENT_AMBULATORY_CARE_PROVIDER_SITE_OTHER): Payer: Self-pay | Admitting: Surgery

## 2022-12-16 ENCOUNTER — Other Ambulatory Visit: Payer: Self-pay

## 2022-12-16 ENCOUNTER — Ambulatory Visit (INDEPENDENT_AMBULATORY_CARE_PROVIDER_SITE_OTHER): Payer: BC Managed Care – PPO | Admitting: Surgery

## 2022-12-16 VITALS — BP 125/57 | HR 72 | Temp 98.4°F | Ht 61.0 in | Wt 153.4 lb

## 2022-12-16 DIAGNOSIS — K219 Gastro-esophageal reflux disease without esophagitis: Secondary | ICD-10-CM

## 2022-12-16 DIAGNOSIS — K760 Fatty (change of) liver, not elsewhere classified: Secondary | ICD-10-CM

## 2022-12-17 ENCOUNTER — Telehealth (INDEPENDENT_AMBULATORY_CARE_PROVIDER_SITE_OTHER): Payer: Self-pay | Admitting: Surgery

## 2022-12-21 ENCOUNTER — Encounter (INDEPENDENT_AMBULATORY_CARE_PROVIDER_SITE_OTHER): Payer: Self-pay | Admitting: Surgery

## 2022-12-21 NOTE — Progress Notes (Signed)
[facility]    Progress Note    Name: Angelica Thompson MRN:  [id number]   Date: 12/16/2022 Age: 67 y.o.            Date of Service:  12/21/2022  [name], 67 y.o. female  Date of Birth:  June 11, 1956  PCP: Peri Maris, DO  Referring:  No ref. provider found     HPI:  Angelica Thompson is a 67 y.o. White female who returns for evaluation of GERD. She is having good control of her symptoms. She takes Nexium and Pepcid daily. She also has a history of fatty liver disease.         Past Medical History:   Diagnosis Date    Anemia     iron def    B12 deficiency     Esophageal reflux     Hypertension     Sleep apnea     Vitamin D deficiency       Past Surgical History:   Procedure Laterality Date    CERVICAL SPINE SURGERY      ELBOW SURGERY      3 elbow surgeries    HX BACK SURGERY      HX BREAST REDUCTION      HX CHOLECYSTECTOMY      HX GASTRIC SLEEVE      HX REFRACTIVE SURGERY Right     wrist    INCONTINENCE SURGERY      ORTHOPEDIC SURGERY      left shoulder surgery    SALPINGECTOMY        Outpatient Medications Marked as Taking for the 12/16/22 encounter (Office Visit) with Santo Zahradnik, Bruna Potter, MD   Medication Sig    Biotin 1 mg Oral Tablet Take by mouth    cyanocobalamin (VITAMIN B12) 1,000 mcg/mL Injection Solution INJECT 1 ML INTO THE MUSCLE EVERY 2 WEEKS    ergocalciferol, vitamin D2, (DRISDOL) 1,250 mcg (50,000 unit) Oral Capsule     esomeprazole magnesium (NEXIUM) 40 mg Oral Capsule, Delayed Release(E.C.)     famotidine (PEPCID) 40 mg Oral Tablet     fenofibric acid (TRILIPIX) 135 mg Oral Capsule, Delayed Release(E.C.)     Lactobac no.41/Bifidobact no.7 (PROBIOTIC-10 ORAL) Take by mouth    multivitamin with iron Oral Tablet Take 1 Tablet by mouth Once a day      Allergies   Allergen Reactions    Sulfa (Sulfonamides)            BP (!) 125/57 (Site: Left, Patient Position: Sitting)   Pulse 72   Temp 36.9 C (98.4 F)   Ht 1.549 m (5' 1"$ )   Wt 69.6 kg (153 lb 6.4  oz)   SpO2 95%   BMI 28.98 kg/m          General: appropriate for age. in no acute distress.    Vital signs are present above and have been reviewed by me     HEENT: Atraumatic, Normocephalic.    Lungs: Nonlabored breathing with symmetric expansion    Heart:Regular wth respect to rate and rythmn.    Abdomen:Soft. Nontender. Nondistended and benign    Psychiatric: Alert and oriented to person, place, and time. affect appropriate       Assessment/Plan:  Assessment/Plan   1. Fatty liver    2. Gastroesophageal reflux disease, unspecified whether esophagitis present         Continue Nexium and Pepcid  Patient will have a follow  up ultrasound of the liver to follow her fatty liver disease    The patient understood the plan therapy and agreed to that plan.    Return in about 6 months (around 06/16/2023).     This note was partially created using voice recognition software and is inherently subject to errors including those of syntax and "sound alike " substitutions which may escape proof reading. In such instances, original meaning may be extrapolated by contextual derivation.    Guillermo Difrancesco B Fayola Meckes, MD,MBA,FACS

## 2023-01-16 ENCOUNTER — Ambulatory Visit (HOSPITAL_COMMUNITY): Payer: Self-pay

## 2023-03-10 ENCOUNTER — Other Ambulatory Visit: Payer: Self-pay

## 2023-03-10 ENCOUNTER — Inpatient Hospital Stay
Admission: RE | Admit: 2023-03-10 | Discharge: 2023-03-10 | Disposition: A | Discharge status: Home / Self Care | Payer: BC Managed Care – PPO | Source: Ambulatory Visit | Attending: Surgery | Admitting: Surgery

## 2023-03-10 DIAGNOSIS — K760 Fatty (change of) liver, not elsewhere classified: Secondary | ICD-10-CM

## 2023-03-31 ENCOUNTER — Other Ambulatory Visit (HOSPITAL_COMMUNITY): Payer: Self-pay | Admitting: ORTHOPEDIC, SPORTS MEDICINE

## 2023-03-31 DIAGNOSIS — M5412 Radiculopathy, cervical region: Secondary | ICD-10-CM

## 2023-03-31 DIAGNOSIS — Z9889 Other specified postprocedural states: Secondary | ICD-10-CM

## 2023-03-31 DIAGNOSIS — M792 Neuralgia and neuritis, unspecified: Secondary | ICD-10-CM

## 2023-04-06 ENCOUNTER — Ambulatory Visit (HOSPITAL_COMMUNITY): Payer: Self-pay

## 2023-04-19 ENCOUNTER — Inpatient Hospital Stay
Admission: RE | Admit: 2023-04-19 | Discharge: 2023-04-19 | Disposition: A | Discharge status: Home / Self Care | Payer: BC Managed Care – PPO | Source: Ambulatory Visit | Attending: ORTHOPEDIC, SPORTS MEDICINE | Admitting: ORTHOPEDIC, SPORTS MEDICINE

## 2023-04-19 ENCOUNTER — Other Ambulatory Visit: Payer: Self-pay

## 2023-04-19 DIAGNOSIS — M792 Neuralgia and neuritis, unspecified: Secondary | ICD-10-CM | POA: Insufficient documentation

## 2023-04-19 DIAGNOSIS — M5412 Radiculopathy, cervical region: Secondary | ICD-10-CM | POA: Insufficient documentation

## 2023-04-19 DIAGNOSIS — Z9889 Other specified postprocedural states: Secondary | ICD-10-CM

## 2023-04-19 MED ORDER — GADOBUTROL 10 MMOL/10 ML (1 MMOL/ML) INTRAVENOUS SOLUTION
10.0000 mL | INTRAVENOUS | Status: AC
Start: 2023-04-19 — End: 2023-04-19
  Administered 2023-04-19: 6 mL via INTRAVENOUS

## 2023-06-04 ENCOUNTER — Other Ambulatory Visit (HOSPITAL_COMMUNITY): Payer: Self-pay | Admitting: Family

## 2023-06-04 DIAGNOSIS — K760 Fatty (change of) liver, not elsewhere classified: Secondary | ICD-10-CM

## 2023-06-16 ENCOUNTER — Encounter (INDEPENDENT_AMBULATORY_CARE_PROVIDER_SITE_OTHER): Payer: Self-pay | Admitting: Surgery

## 2023-07-20 ENCOUNTER — Other Ambulatory Visit: Payer: Self-pay

## 2023-07-20 ENCOUNTER — Encounter (HOSPITAL_COMMUNITY): Payer: Self-pay | Admitting: Family

## 2023-07-20 ENCOUNTER — Emergency Department (HOSPITAL_COMMUNITY): Payer: BC Managed Care – PPO

## 2023-07-20 ENCOUNTER — Emergency Department
Admission: EM | Admit: 2023-07-20 | Discharge: 2023-07-20 | Disposition: A | Discharge status: Home / Self Care | Payer: BC Managed Care – PPO | Attending: Family | Admitting: Family

## 2023-07-20 DIAGNOSIS — N390 Urinary tract infection, site not specified: Secondary | ICD-10-CM | POA: Insufficient documentation

## 2023-07-20 DIAGNOSIS — W2209XA Striking against other stationary object, initial encounter: Secondary | ICD-10-CM | POA: Insufficient documentation

## 2023-07-20 DIAGNOSIS — R519 Headache, unspecified: Secondary | ICD-10-CM | POA: Insufficient documentation

## 2023-07-20 DIAGNOSIS — S0990XA Unspecified injury of head, initial encounter: Secondary | ICD-10-CM | POA: Insufficient documentation

## 2023-07-20 DIAGNOSIS — W228XXA Striking against or struck by other objects, initial encounter: Secondary | ICD-10-CM

## 2023-07-20 LAB — PT/INR
INR: 1.05 (ref 0.84–1.10)
PROTHROMBIN TIME: 12.3 seconds (ref 9.8–12.7)

## 2023-07-20 LAB — URINALYSIS, MICROSCOPIC
HYALINE CASTS: 1 /lpf — ABNORMAL HIGH (ref <0)
RBCS: 3 /hpf (ref <4)
SQUAMOUS EPITHELIAL: 2 /hpf (ref <28)
WBCS: 22 /hpf — ABNORMAL HIGH (ref <6)

## 2023-07-20 LAB — URINALYSIS, MACROSCOPIC
BILIRUBIN: NEGATIVE mg/dL
BLOOD: NEGATIVE mg/dL
GLUCOSE: NEGATIVE mg/dL
KETONES: NEGATIVE mg/dL
LEUKOCYTES: 250 WBCs/uL — AB
PH: 5.5 (ref 5.0–9.0)
PROTEIN: NEGATIVE mg/dL
SPECIFIC GRAVITY: 1.021 (ref 1.002–1.030)
UROBILINOGEN: NORMAL mg/dL

## 2023-07-20 LAB — COMPREHENSIVE METABOLIC PANEL, NON-FASTING
ALBUMIN/GLOBULIN RATIO: 1.8 — ABNORMAL HIGH (ref 0.8–1.4)
ALBUMIN: 4.4 g/dL (ref 3.5–5.7)
ALKALINE PHOSPHATASE: 66 U/L (ref 34–104)
ALT (SGPT): 19 U/L (ref 7–52)
ANION GAP: 4 mmol/L (ref 4–13)
AST (SGOT): 28 U/L (ref 13–39)
BILIRUBIN TOTAL: 0.5 mg/dL (ref 0.3–1.0)
BUN/CREA RATIO: 18 (ref 6–22)
BUN: 16 mg/dL (ref 7–25)
CALCIUM, CORRECTED: 9.1 mg/dL (ref 8.9–10.8)
CALCIUM: 9.4 mg/dL (ref 8.6–10.3)
CHLORIDE: 109 mmol/L — ABNORMAL HIGH (ref 98–107)
CO2 TOTAL: 29 mmol/L (ref 21–31)
CREATININE: 0.87 mg/dL (ref 0.60–1.30)
ESTIMATED GFR: 74 mL/min/{1.73_m2} (ref >59)
GLOBULIN: 2.4 — ABNORMAL LOW (ref 2.9–5.4)
GLUCOSE: 90 mg/dL (ref 74–109)
OSMOLALITY, CALCULATED: 284 mOsm/kg (ref 270–290)
POTASSIUM: 3.9 mmol/L (ref 3.5–5.1)
PROTEIN TOTAL: 6.8 g/dL (ref 6.4–8.9)
SODIUM: 142 mmol/L (ref 136–145)

## 2023-07-20 LAB — PTT (PARTIAL THROMBOPLASTIN TIME): APTT: 33.5 seconds (ref 25.0–38.0)

## 2023-07-20 LAB — CBC WITH DIFF
BASOPHIL #: 0.1 10*3/uL (ref 0.00–0.10)
BASOPHIL %: 1 % (ref 0–1)
EOSINOPHIL #: 0.3 10*3/uL (ref 0.00–0.50)
EOSINOPHIL %: 4 % (ref 1–7)
HCT: 42.1 % — ABNORMAL HIGH (ref 31.2–41.9)
HGB: 14.1 g/dL (ref 10.9–14.3)
LYMPHOCYTE #: 2.1 10*3/uL (ref 1.00–3.00)
LYMPHOCYTE %: 23 % (ref 16–44)
MCH: 29.7 pg (ref 24.7–32.8)
MCHC: 33.5 g/dL (ref 32.3–35.6)
MCV: 88.9 fL (ref 75.5–95.3)
MONOCYTE #: 0.6 10*3/uL (ref 0.30–1.00)
MONOCYTE %: 6 % (ref 5–13)
MPV: 7.6 fL — ABNORMAL LOW (ref 7.9–10.8)
NEUTROPHIL #: 5.9 10*3/uL (ref 1.85–7.80)
NEUTROPHIL %: 66 % (ref 43–77)
PLATELETS: 361 10*3/uL (ref 140–440)
RBC: 4.74 10*6/uL (ref 3.63–4.92)
RDW: 13.1 % (ref 12.3–17.7)
WBC: 9 10*3/uL (ref 3.8–11.8)

## 2023-07-20 LAB — GRAY TOP TUBE

## 2023-07-20 LAB — GOLD TOP TUBE

## 2023-07-20 MED ORDER — NITROFURANTOIN MONOHYDRATE/MACROCRYSTALS 100 MG CAPSULE
100.0000 mg | ORAL_CAPSULE | Freq: Two times a day (BID) | ORAL | 0 refills | Status: AC
Start: 2023-07-20 — End: 2023-07-27

## 2023-07-20 NOTE — ED Nurses Note (Signed)
Pt evaluated and treated by medical provider while in waiting room area. No primary RN assigned; therefore, no assessment performed. All paperwork given and questions answered. Pt acknowledged all understanding. Leaving waiting area.

## 2023-07-20 NOTE — Discharge Instructions (Signed)
FOLLOW UP WITH THE PRIMARY CARE PROVIDER AS SOON AS POSSIBLE BUT NO LATER THAN 3 DAYS NOW.  IF NO PRIMARY CARE PROVIDER EXISTS, THEN THE PATIENT IS INSTRUCTED TO ESTABLISH CARE WITH A PRIMARY CARE PROVIDER. FOLLOW-UP WITH ANY SPECIALIST PROVIDER AS INDICATED AS SOON AS POSSIBLE BUT NO LATER THAN 3 DAYS.  NOTIFY THE PRIMARY CARE PROVIDER THAT YOU WERE IN THE EMERGENCY DEPARTMENT WITHIN 24 HOURS OF DISCHARGE TO FOLLOW-UP ON YOUR RESULTS AND/OR TREATMENTS.  RETURN TO THE EMERGENCY DEPARTMENT IMMEDIATELY IF NEEDED, NO BETTER, WORSE, NEW SYMPTOMS ARISE, OR YOU CANNOT FOLLOW-UP WITH YOUR PRIMARY CARE PROVIDER IN THE PRESCRIBED TIMEFRAME.

## 2023-07-20 NOTE — ED Provider Notes (Signed)
[facility]  ED Primary Provider Note  History of Present Illness   Chief Complaint   Patient presents with    Headache    Head Injury     Angelica Thompson is a 67 y.o. female who had concerns including Headache and Head Injury.  Arrival: The patient arrived by Car    67 year old female presents to the emergency room for evaluation head injury that occurred 2 days ago.  Patient states she was working under her deck when she raised up and hit her head on a spindle from under her deck.  She denies loss of consciousness, blurred vision, double vision, nausea, vomiting.  Patient states today the pain began radiating from the injuries site down to the right ear.  She denies any other associated signs and symptoms at this time.  She has no numbness, tingling.  She does note intermittent headaches.  She declines medication for headaches.  She does note some pinpoint soreness to the scalp at the area of injury.      History Reviewed This Encounter: Medical History  Surgical History  Family History  Social History    Physical Exam   ED Triage Vitals [07/20/23 0912]   BP (Non-Invasive) 119/70   Heart Rate 70   Respiratory Rate 18   Temperature 36.2 C (97.1 F)   SpO2 97 %   Weight 70.8 kg (156 lb)   Height 1.549 m (5\' 1" )     Physical Exam  Constitutional:       Appearance: Normal appearance. She is normal weight.   HENT:      Head: Normocephalic and atraumatic.      Left Ear: Tympanic membrane normal.      Ears:      Comments: Dull tympanic membrane right side, no erythema.     Nose: Nose normal.      Mouth/Throat:      Mouth: Mucous membranes are moist.      Pharynx: Oropharynx is clear.   Eyes:      Extraocular Movements: Extraocular movements intact.      Conjunctiva/sclera: Conjunctivae normal.      Pupils: Pupils are equal, round, and reactive to light.   Cardiovascular:      Rate and Rhythm: Normal rate and regular rhythm.      Pulses: Normal pulses.      Heart sounds: Normal heart  sounds.   Pulmonary:      Effort: Pulmonary effort is normal.      Breath sounds: Normal breath sounds.   Abdominal:      General: Abdomen is flat. Bowel sounds are normal.      Palpations: Abdomen is soft.   Musculoskeletal:         General: Normal range of motion.      Cervical back: Normal range of motion and neck supple.   Skin:     General: Skin is warm and dry.   Neurological:      General: No focal deficit present.      Mental Status: She is alert and oriented to person, place, and time. Mental status is at baseline.   Psychiatric:         Mood and Affect: Mood normal.         Behavior: Behavior normal.         Thought Content: Thought content normal.         Judgment: Judgment normal.       Patient Data  Labs Ordered/Reviewed   COMPREHENSIVE METABOLIC PANEL, NON-FASTING - Abnormal; Notable for the following components:       Result Value    CHLORIDE 109 (*)     ALBUMIN/GLOBULIN RATIO 1.8 (*)     GLOBULIN 2.4 (*)     All other components within normal limits    Narrative:     Estimated Glomerular Filtration Rate (eGFR) is calculated using the CKD-EPI (2021) equation, intended for patients 21 years of age and older. If gender is not documented or "unknown", there will be no eGFR calculation.     CBC WITH DIFF - Abnormal; Notable for the following components:    HCT 42.1 (*)     MPV 7.6 (*)     All other components within normal limits   URINALYSIS, MACROSCOPIC - Abnormal; Notable for the following components:    LEUKOCYTES 250 (*)     NITRITE 2+ (*)     All other components within normal limits   URINALYSIS, MICROSCOPIC - Abnormal; Notable for the following components:    BACTERIA Occasional (*)     WBCS 22 (*)     HYALINE CASTS 1 (*)     All other components within normal limits   PT/INR - Normal    Narrative:     INR OF 2.0-3.0  RECOMMENDED FOR: PROPHYLAXIS/TREATMENT OF VENEOUS THROMBOSIS, PULMONARY EMBOLISM, PREVENTION OF SYSTEMIC EMBOLISM FROM ATRIAL FIBRILATION, MYOCARDIAL INFARCTION.    INR OF  2.5-3.5  RECOMMENDED FOR MECHANICAL PROSTHETIC HEART VALVES, RECURRENT SYSTEMIC EMBOLISM, RECURRENT MYOCARDIAL INFARCTION.     PTT (PARTIAL THROMBOPLASTIN TIME) - Normal   URINE CULTURE,ROUTINE   CBC/DIFF    Narrative:     The following orders were created for panel order CBC/DIFF.  Procedure                               Abnormality         Status                     ---------                               -----------         ------                     CBC WITH ZOXW[[id number]]                Abnormal            Final result                 Please view results for these tests on the individual orders.   URINALYSIS, MACROSCOPIC AND MICROSCOPIC W/CULTURE REFLEX    Narrative:     The following orders were created for panel order URINALYSIS, MACROSCOPIC AND MICROSCOPIC W/CULTURE REFLEX.  Procedure                               Abnormality         Status                     ---------                               -----------         ------  URINALYSIS, MACROSCOPIC[[id number]]      Abnormal            Final result               URINALYSIS, MICROSCOPIC[[id number]]      Abnormal            Final result                 Please view results for these tests on the individual orders.   EXTRA TUBES    Narrative:     The following orders were created for panel order EXTRA TUBES.  Procedure                               Abnormality         Status                     ---------                               -----------         ------                     GOLD TOP WJXB[[id number]]                                    In process                 GRAY TOP ZHYQ[[id number]]                                    In process                   Please view results for these tests on the individual orders.   GOLD TOP TUBE   GRAY TOP TUBE     CT BRAIN WO IV CONTRAST   Final Result by Edi, Radresults In (09/16 0956)   NO ACUTE INTRACRANIAL HEMORRHAGE         One or more dose reduction techniques were used (e.g., Automated exposure  control, adjustment of the mA and/or kV according to patient size, use of iterative reconstruction technique).         Radiologist location ID: [id number]           Medical Decision Making        Medical Decision Making  Will discharge patient home due to no significant/urgent findings requiring admission.  CT of the head is normal, neurologically she was intact.  She has no open wounds at the site of impact, she does have some tenderness noted to the area with some mild bruising.  Vital signs are stable she is afebrile, O2 saturations 97% on room air.  She does have findings consistent with a urinary tract infection, did discuss her lab results with her, she verbalized understanding.  Patient will be placed on Macrobid for urinary tract infection, this was transmitted to her pharmacy.  Patient instructed to increase fluid intake, she was offered ibuprofen but declined statin states she will does take over-the-counter medication.  Patient to follow with her regular doctor in 2-3 days and return for problems or worsening.  Patient verbalize understanding of instructions  Amount and/or Complexity of Data Reviewed  Labs: ordered. Decision-making details documented in ED Course.  Radiology: ordered. Decision-making details documented in ED Course.    Risk  Prescription drug management.    Critical Care  Total time providing critical care: 0 minutes                   Clinical Impression   Headache (Primary)   Injury of head, initial encounter   UTI (urinary tract infection)       Disposition: Discharged    Current Discharge Medication List        START taking these medications    Details   nitrofurantoin monohyd/m-cryst (MACROBID) 100 mg Oral Capsule Take 1 Capsule (100 mg total) by mouth Twice daily for 7 days  Qty: 14 Capsule, Refills: 0

## 2023-07-20 NOTE — ED Triage Notes (Signed)
States she stood up and hit her head on the porch Saturday night. States pain on the right side down into the ear. Denies LOC. Headache since then. Denies blurred vision/dizziness/nausea.

## 2023-07-22 LAB — URINE CULTURE,ROUTINE

## 2023-07-23 LAB — URINE CULTURE,ROUTINE: URINE CULTURE: >100000 — AB

## 2023-08-03 ENCOUNTER — Other Ambulatory Visit: Payer: Self-pay

## 2023-08-03 ENCOUNTER — Inpatient Hospital Stay
Admission: RE | Admit: 2023-08-03 | Discharge: 2023-08-03 | Disposition: A | Discharge status: Home / Self Care | Payer: BC Managed Care – PPO | Source: Ambulatory Visit | Attending: Family | Admitting: Family

## 2023-08-03 DIAGNOSIS — K760 Fatty (change of) liver, not elsewhere classified: Secondary | ICD-10-CM | POA: Insufficient documentation

## 2024-03-15 ENCOUNTER — Encounter (INDEPENDENT_AMBULATORY_CARE_PROVIDER_SITE_OTHER): Payer: Self-pay | Admitting: Surgery

## 2024-03-15 ENCOUNTER — Other Ambulatory Visit: Payer: Self-pay

## 2024-03-15 ENCOUNTER — Ambulatory Visit (INDEPENDENT_AMBULATORY_CARE_PROVIDER_SITE_OTHER): Payer: Self-pay | Admitting: Surgery

## 2024-03-15 VITALS — BP 127/70 | HR 83 | Temp 98.1°F | Resp 18 | Ht 61.0 in | Wt 156.0 lb

## 2024-03-15 DIAGNOSIS — Z1211 Encounter for screening for malignant neoplasm of colon: Secondary | ICD-10-CM

## 2024-03-15 DIAGNOSIS — K219 Gastro-esophageal reflux disease without esophagitis: Secondary | ICD-10-CM

## 2024-03-15 DIAGNOSIS — Z01818 Encounter for other preprocedural examination: Secondary | ICD-10-CM

## 2024-03-15 MED ORDER — SODIUM SUL 1.479 GRAM-POTAS CH 0.188 GRAM-MAGNES SUL 0.225 GRAM TABLET
ORAL_TABLET | ORAL | 0 refills | Status: DC
Start: 2024-03-15 — End: 2024-07-27

## 2024-03-16 NOTE — Progress Notes (Signed)
 GENERAL SURGERY, [facility]  [address]  [place] [address]    History and Physical    Name: Angelica Thompson MRN:  [id number]   Date: 03/15/2024 DOB:  01-26-1956 (68 y.o.)              Reason for Visit: EGD (GERD)    History of Present Illness  Ms. Gaumer presents today for EGD and colonoscopy because of gastroesophageal reflux disease and screening colonoscopy.  The patient takes Nexium and Pepcid for her gastroesophageal reflux disease.  She has noticed some increase in her heartburn for which she has been given Carafate.  The patient has last EGD was approximately 4 years ago performed by Dr. Alyssa Backbone.    Negative diabetes, blood thinner, family history colon cancer      Review of the result(s) of each unique test:  Patient underwent diagnostic testing ( none) prior to this dates visit.  I have personally reviewed the results and that serves as a component of the medical decision making for this encounter       Review of prior external note(s) from each unique source:  Patients referral to this office including a recent assessment by the referring provider.  This was reviewed by me for this unique office visit for the indication and intent of the referral as well as any pertinent medical or surgical history relevant to the patients independent evaluation by me today.      Patient History  Past Medical History:   Diagnosis Date    Anemia     iron def    B12 deficiency     Esophageal reflux     Hypertension     Sleep apnea     Vitamin D deficiency          Past Surgical History:   Procedure Laterality Date    CERVICAL SPINE SURGERY      ELBOW SURGERY      3 elbow surgeries    HX BACK SURGERY      HX BREAST REDUCTION      HX CHOLECYSTECTOMY      HX GASTRIC SLEEVE      HX REFRACTIVE SURGERY Right     wrist    INCONTINENCE SURGERY      ORTHOPEDIC SURGERY      left shoulder surgery    SALPINGECTOMY           Current Outpatient Medications   Medication Sig    cyanocobalamin  (VITAMIN B12) 1,000 mcg/mL Injection Solution INJECT 1 ML INTO THE MUSCLE EVERY 2 WEEKS    ergocalciferol, vitamin D2, (DRISDOL) 1,250 mcg (50,000 unit) Oral Capsule     esomeprazole magnesium (NEXIUM) 40 mg Oral Capsule, Delayed Release(E.C.)     famotidine (PEPCID) 40 mg Oral Tablet     Lactobac no.41/Bifidobact no.7 (PROBIOTIC-10 ORAL) Take by mouth    multivitamin with iron Oral Tablet Take 1 Tablet by mouth Daily    sod sulf-pot chloride-mag sulf 1.479-0.188- 0.225 gram Oral Tablet Take 12 pills at 10-10:30 am. Take 12 pills at 6-6:30pm. Drink plenty of fluids all day/evening.     Allergies   Allergen Reactions    Macrobid  [Nitrofurantoin  Monohyd/M-Cryst]     Sulfa (Sulfonamides)      Family Medical History:       Problem Relation (Age of Onset)    Brain cancer Sister    Leukemia Mother    Lung Cancer Sister, Brother    Throat cancer  Mother            Social History     Tobacco Use    Smoking status: Never    Smokeless tobacco: Never   Vaping Use    Vaping status: Never Used   Substance Use Topics    Alcohol use: Not Currently    Drug use: Not Currently            Physical Examination:  Vitals:    03/15/24 1519   BP: 127/70   Pulse: 83   Resp: 18   Temp: 36.7 C (98.1 F)   SpO2: 96%   Weight: 70.8 kg (156 lb)   Height: 1.549 m (5\' 1" )   BMI: 29.48        General: appropriate for age. in no acute distress.    Vital signs are present above and have been reviewed by me     HEENT: Atraumatic, Normocephalic. PERRLA. EOMI. Nose clear. Throat clear    Lungs: Nonlabored breathing with symmetric expansion. Clear to auscultation bilaterally    Heart:Regular wth respect to rate and rythmn.    Abdomen:Soft. Nontender. Nondistended and benign    Extremities: Grossly normal. No major deformities     Neuro:  Grossly normal motor and sensory function    Psychiatric: Alert and oriented to person, place, and time. affect appropriate      Assessment and Plan  EGD and colonoscopy scheduled for 04/20/2024 at 1:00 p.m.      Discussed indications, risks and benefits of esophagogastroduodenoscopy and colonoscopy with the patient.  Discussed the possibility of polypectomy, biopsies, and possible repeat examinations.  Risks include bleeding, sedation risks, possibility of missed diagnosis of polyp or malignancy, and remote possibilities of perforation and death.  All questions were answered, and informed consent was clearly obtained.    Follow Up:  No follow-ups on file.      ICD-10-CM    1. Gastroesophageal reflux disease, unspecified whether esophagitis present  K21.9       2. Encounter for screening colonoscopy  Z12.11           Milanie Rosenfield B Natoshia Souter, MD ,MBA,FACS    I appreciate the opportunity to be involved in the care of your patients.  If you have any questions or concerns regarding this encounter, please do not hesitate to contact me at your convenience.      This note may have been partially generated using MModal Fluency Direct system, and there may be some incorrect words, spellings, and punctuation that were not noted in checking the note before saving, though effort was made to avoid such errors.

## 2024-07-27 ENCOUNTER — Encounter (HOSPITAL_COMMUNITY)
Admission: RE | Disposition: A | Discharge status: Home / Self Care | Payer: Self-pay | Source: Ambulatory Visit | Attending: Surgery

## 2024-07-27 ENCOUNTER — Ambulatory Visit
Admission: RE | Admit: 2024-07-27 | Discharge: 2024-07-27 | Disposition: A | Discharge status: Home / Self Care | Source: Ambulatory Visit | Attending: Surgery | Admitting: Surgery

## 2024-07-27 ENCOUNTER — Ambulatory Visit (HOSPITAL_COMMUNITY): Admitting: Certified Registered"

## 2024-07-27 ENCOUNTER — Encounter (HOSPITAL_COMMUNITY): Payer: Self-pay | Admitting: Surgery

## 2024-07-27 ENCOUNTER — Ambulatory Visit (HOSPITAL_COMMUNITY): Admitting: Surgery

## 2024-07-27 ENCOUNTER — Other Ambulatory Visit: Payer: Self-pay

## 2024-07-27 DIAGNOSIS — K5730 Diverticulosis of large intestine without perforation or abscess without bleeding: Secondary | ICD-10-CM | POA: Insufficient documentation

## 2024-07-27 DIAGNOSIS — K565 Intestinal adhesions [bands], unspecified as to partial versus complete obstruction: Secondary | ICD-10-CM | POA: Insufficient documentation

## 2024-07-27 DIAGNOSIS — K2100 Gastro-esophageal reflux disease with esophagitis, without bleeding: Secondary | ICD-10-CM | POA: Insufficient documentation

## 2024-07-27 DIAGNOSIS — Z1211 Encounter for screening for malignant neoplasm of colon: Secondary | ICD-10-CM | POA: Insufficient documentation

## 2024-07-27 DIAGNOSIS — K2950 Unspecified chronic gastritis without bleeding: Secondary | ICD-10-CM | POA: Insufficient documentation

## 2024-07-27 DIAGNOSIS — Z79899 Other long term (current) drug therapy: Secondary | ICD-10-CM | POA: Insufficient documentation

## 2024-07-27 SURGERY — GASTROSCOPY WITH BIOPSY
Anesthesia: General | Wound class: Clean Contaminated Wounds-The respiratory, GI, Genital, or urinary

## 2024-07-27 MED ORDER — PROPOFOL 10 MG/ML IV BOLUS
INJECTION | Freq: Once | INTRAVENOUS | Status: DC | PRN
Start: 2024-07-27 — End: 2024-07-27
  Administered 2024-07-27 (×5): 50 mg via INTRAVENOUS

## 2024-07-27 MED ORDER — LACTATED RINGERS INTRAVENOUS SOLUTION
INTRAVENOUS | Status: DC | PRN
Start: 2024-07-27 — End: 2024-07-27

## 2024-07-27 MED ORDER — SUCRALFATE 1 GRAM TABLET
1.0000 g | ORAL_TABLET | Freq: Three times a day (TID) | ORAL | 3 refills | Status: AC
Start: 2024-07-27 — End: ?

## 2024-07-27 SURGICAL SUPPLY — 3 items
CLEANER INSTRUMENT PRE-KLENZ 13.5 OZ (MISCELLANEOUS PT CARE ITEMS) ×1 IMPLANT
FORCEPS BIOPSY NEEDLE 240CM 2.2MM RJ 4 2.8MM STD CPC STRL DISP ORNG (ENDOSCOPIC SUPPLIES) ×1 IMPLANT
VALVE AIR/H20 DEFENDO BUTTON KIT SUCT BIOPSY STRL DISP (ENDOSCOPIC SUPPLIES) ×1 IMPLANT

## 2024-07-27 NOTE — Anesthesia Postprocedure Evaluation (Signed)
 Anesthesia Post Op Evaluation    Patient: Angelica Thompson  Procedure(s):  EGD WITH BIOPSY  COLONOSCOPY    Last Vitals:Temperature: 36.5 C (97.7 F) (07/27/24 0850)  Heart Rate: 56 (07/27/24 0850)  BP (Non-Invasive): (!) 120/53 (07/27/24 0850)  Respiratory Rate: 15 (07/27/24 0850)  SpO2: 98 % (07/27/24 0850)    No notable events documented.    Patient is sufficiently recovered from the effects of anesthesia to participate in the evaluation and has returned to their pre-procedure level.  Patient location during evaluation: PACU       Patient participation: complete - patient participated  Level of consciousness: awake and alert and responsive to verbal stimuli    Pain score: 0  Pain management: adequate  Airway patency: patent    Anesthetic complications: no  Cardiovascular status: acceptable  Respiratory status: acceptable  Hydration status: acceptable  Patient post-procedure temperature: Pt Normothermic   PONV Status: Absent

## 2024-07-27 NOTE — H&P (Signed)
 [facility]  General Surgery  History and Physical    Date of Service:  07/27/2024  Angelica, Thompson, 68 y.o. female  Date of Admission:  07/27/2024  Date of Birth:  September 21, 1956  PCP: Etta Caldron Hill-Hurt, PA-C    Reason for admission:  EGD and colonoscopy    HPI:  Angelica Thompson is a 68 y.o. White female who is admitted for Gastroesophageal reflux disease, unspecified whether esophagitis present [K21.9]  Encounter for screening colonoscopy for non-high-risk patient [Z12.11]     Ms. Littleton presents today for EGD and colonoscopy because of gastroesophageal reflux disease and screening colonoscopy.  The patient takes Nexium and Pepcid for her gastroesophageal reflux disease.  She has noticed some increase in her heartburn for which she has been given Carafate .  The patient has last EGD was approximately 4 years ago performed by Dr. MARLA Blanch.     Negative diabetes, blood thinner, family history colon cancer        Review of the result(s) of each unique test:  Patient underwent diagnostic testing ( none) prior to this dates visit.  I have personally reviewed the results and that serves as a component of the medical decision making for this encounter        Review of prior external note(s) from each unique source:  Patients referral to this office including a recent assessment by the referring provider.  This was reviewed by me for this unique office visit for the indication and intent of the referral as well as any pertinent medical or surgical history relevant to the patients independent evaluation by me today.    Past Medical History:   Diagnosis Date    Anemia     iron def    B12 deficiency     Esophageal reflux     Hypertension     Sleep apnea     Vitamin D deficiency       Past Surgical History:   Procedure Laterality Date    CERVICAL SPINE SURGERY      ELBOW SURGERY      3 elbow surgeries    HX BACK SURGERY      HX BREAST REDUCTION      HX CHOLECYSTECTOMY      HX GASTRIC SLEEVE      HX  REFRACTIVE SURGERY Right     wrist    INCONTINENCE SURGERY      ORTHOPEDIC SURGERY      left shoulder surgery    SALPINGECTOMY        Social History[1]    Family Medical History:       Problem Relation (Age of Onset)    Brain cancer Sister    Leukemia Mother    Lung Cancer Sister, Brother    Throat cancer Mother           Medications Prior to Admission       Prescriptions    cyanocobalamin (VITAMIN B12) 1,000 mcg/mL Injection Solution    INJECT 1 ML INTO THE MUSCLE EVERY 2 WEEKS    ergocalciferol, vitamin D2, (DRISDOL) 1,250 mcg (50,000 unit) Oral Capsule    esomeprazole magnesium (NEXIUM) 40 mg Oral Capsule, Delayed Release(E.C.)    famotidine (PEPCID) 40 mg Oral Tablet    Lactobac no.41/Bifidobact no.7 (PROBIOTIC-10 ORAL)    Take by mouth    multivitamin with iron Oral Tablet    Take 1 Tablet by mouth Daily    sod sulf-pot chloride-mag sulf 1.479-0.188- 0.225 gram Oral  Tablet    Take 12 pills at 10-10:30 am. Take 12 pills at 6-6:30pm. Drink plenty of fluids all day/evening.           Allergies[2]       No data found.       General: appropriate for age. in no acute distress.    Vital signs are present above and have been reviewed by me     HEENT: Atraumatic, Normocephalic. PERRLA, EOMI. Nose clear. Throat clear.    Lungs: Nonlabored breathing with symmetric expansion.  Clear to auscultation bilaterally    Heart:Regular wth respect to rate and rythmn.    Abdomen:Soft. Nontender. Nondistended and benign    Extremities:  Grossly normal with good range of motion and no major deformities.    Neuro:  Grossly normal motor and sensory function. CN's II through XII intact.    Psychiatric: Alert and oriented to person, place, and time. affect appropriate    Laboratory Data:     No results found for any visits on 07/27/24 (from the past 24 hours).    Imaging Studies:    No orders to display        Assessment/Plan:  Gastroesophageal reflux disease, unspecified whether esophagitis present [K21.9]  Encounter for screening  colonoscopy for non-high-risk patient [Z12.11]    EGD and colonoscopy scheduled for 07/27/2024     Discussed indications, risks and benefits of esophagogastroduodenoscopy and colonoscopy with the patient.  Discussed the possibility of polypectomy, biopsies, and possible repeat examinations.  Risks include bleeding, sedation risks, possibility of missed diagnosis of polyp or malignancy, and remote possibilities of perforation and death.  All questions were answered, and informed consent was clearly obtained.    This note was partially created using voice recognition software and is inherently subject to errors including those of syntax and sound alike  substitutions which may escape proof reading. In such instances, original meaning may be extrapolated by contextual derivation.    Joshwa Hemric B Oddis Westling, MD, MBA, FACS         [1]   Social History  Tobacco Use    Smoking status: Never    Smokeless tobacco: Never   Vaping Use    Vaping status: Never Used   Substance Use Topics    Alcohol use: Not Currently    Drug use: Not Currently   [2]   Allergies  Allergen Reactions    Macrobid  [Nitrofurantoin  Monohyd/M-Cryst]     Sulfa (Sulfonamides)

## 2024-07-27 NOTE — Anesthesia Preprocedure Evaluation (Signed)
 ANESTHESIA PRE-OP EVALUATION  Planned Procedure: EGD WITH BIOPSY  COLONOSCOPY  Review of Systems  Anesthesia Complications comment: Difficult to wake up        patient summary reviewed  nursing notes reviewed        Pulmonary   sleep apnea,   Cardiovascular     Exercise Tolerance: > or = 4 METS        GI/Hepatic/Renal    GERD and liver disease (Fatty Liver)        Endo/Other    anemia,      Neuro/Psych/MS    back abnormality, Neck problems (Cervical fusion)     Cancer    negative hematology/oncology ROS,                   Physical Assessment      Airway       Mallampati: II    TM distance: >3 FB    Neck ROM: limited  Mouth Opening: good.  No Facial hair          Dental                 Comment: Dentition intact       Pulmonary    Breath sounds clear to auscultation       Cardiovascular    Rhythm: regular  Rate: Normal       Other findings            Plan  ASA 2     Planned anesthesia type: general     total intravenous anesthesia                    Intravenous induction       Anesthetic plan and risks discussed with patient  signed consent obtained          Patient's NPO status is appropriate for Anesthesia.

## 2024-07-27 NOTE — Discharge Instructions (Addendum)
 SURGICAL DISCHARGE INSTRUCTIONS     Dr. Marlyce, Gene B, MD  performed your EGD WITH BIOPSY, COLONOSCOPY today at the [facility]    [facility]:  Monday through Friday from 8 a.m. - 4 p.m.: [phone]    For T&D: [phone]  Between 4 p.m. - 8 a.m., weekends and holidays:  Call ER [phone]    PLEASE SEE WRITTEN HANDOUTS AS DISCUSSED BY YOUR NURSE      ANESTHESIA INFORMATION   ANESTHESIA -- ADULT PATIENTS:  You have received intravenous sedation / general anesthesia, and you may feel drowsy and light-headed for several hours. You may even experience some forgetfulness of the procedure. DO NOT DRIVE A MOTOR VEHICLE or perform any activity requiring complete alertness or coordination until you feel fully awake in about 24-48 hours. Do not drink alcoholic beverages for at least 24 hours. Do not stay alone, you must have a responsible adult available to be with you. You may also experience a dry mouth or nausea for 24 hours. This is a normal side effect and will disappear as the effects of the medication wear off.    REMEMBER   If you experience any difficulty breathing, chest pain, bleeding that you feel is excessive, persistent nausea or vomiting or for any other concerns:  Call your physician Dr.  Marlyce, Amaryllis NOVAK, MD   at [phone] . You may also ask to have the general doctor on call paged. They are available to you 24 hours a day.      SPECIAL INSTRUCTIONS / COMMENTS   Findings: Moderate gastritis  Grade A esophagitis    Mild sigmoid diverticulosis  Tethering of the sigmoid colon     Tethering of the colon occurs usually after having abdominal surgery. This makes it difficult to guide the scope through the colon.     Next colonoscopy in 10 years

## 2024-07-27 NOTE — OR Surgeon (Signed)
 [facility]      Patient Name: Angelica Thompson, Coard Number: [id number]  Date of Service: 07/27/2024   Date of Birth: 05-09-1956      Pre-Operative Diagnosis: GERD  Screening     Post-Operative Diagnosis: Moderate gastritis  Grade A esophagitis    Mild sigmoid diverticulosis  Tethering of the sigmoid colon    Procedure(s)/Description:  EGD WITH BIOPSY: 43239 (CPT)    COLONOSCOPY: 54621 (CPT)       Attending Surgeon: Amaryllis KATHEE Denise, MD     Anesthesia:  CRNA: Jenise Service, CRNA    Anesthesia Type: .General     Specimens Removed:   ID Type Source Tests Collected by Time Destination   1 : bx x1 Tissue Antrum SURGICAL PATHOLOGY SPECIMEN Jung Yurchak, Amaryllis KATHEE, MD 07/27/2024 [phone]      Order Name Source Comment Collection Info Order Time   SURGICAL PATHOLOGY SPECIMEN Antrum Pre-op diagnosis:  GERD  Screening Collected By: Denise Amaryllis KATHEE, MD 07/27/2024  8:39 AM     Release to patient   Manual release only          Reason for preventing immediate release   Reasonable likelihood of causing patient harm            The patient indicates that they have read and understood the preoperative EGD with biopsy and colonoscopy consent form. The benefits, risks and alternatives to the procedure were discussed. I specifically discussed the risk of bleeding and/or perforation requiring operation.The patient indicates they have no further question and wish to proceed. Informed consent was obtained from the patient and/or medical power of attorney.  The patient was brought in to the endoscopy suite and placed on the stretcher in the left lateral decubitus position. The video gastroscope was then inserted into the mouth, down the esophagus and into the stomach after adequate IV and topical anesthetic was provided. The stomach was insufflated with air and examination of the stomach was performed. Antral biopsy was obtained and sent to the Pathology department for microscopic examination. Hemostasis was well obtained.  The  scope was advanced to the pyloric channel.  The pylorus was cannulated and the scope was advanced into the duodenum without any difficulty. Examination of the first and second portions of the duodenum was performed and the findings were noted as above. The scope was withdrawn back into the stomach in which an extensive examination was performed with the above noted findings. Retroflexion of the scope was performed which gave good visualization of the proximal stomach and GE junction from below. The scope was pulled back up into the proximal stomach and GE junction, with the above noted findings.  The scope was withdrawn back up into the esophagus and out the mouth without any difficulty. The patient tolerated the procedure well. No complications were encountered.   There were no unplanned events.  EKG, pulse, pulse oximetry and blood pressure were monitored throughout the entire procedure.  The patient was positioned on the stretcher in the left lateral decubitus position. After IV sedation was given, full finger digital rectal examination was performed with a circumferential sweep of the distal rectal mucosa. Subsequently, the flexible colonoscope was inserted into the rectum and passed without any difficulty. The colonoscope was then advanced up into the sigmoid colon, descending colon, transverse colon, right colon and cecum without any difficulty. Gross examination of each section of the colon was performed. Cecal intubation was achieved and the appendiceal orifice and ileocecal valve were identified. The  operative findings were noted as described above. The colonoscope was withdrawn carefully examining the mucosa as the scope was being extracted with particular attention paid to the proximal sides of folds, flexures, bends and rectal valves. At approximately 10 cm. from the anal verge, the colonoscope was retroflexed to fully examine the distal rectum. The colonoscope was removed and a repeat digital rectal  examination was performed at the completion of the procedure. The patient tolerated the procedure well. No intraoperative complications were encountered.  EKG, pulse, pulse oximetry and blood pressure were monitored throughout the entire procedure.  There were no unplanned events.    The patient was instructed to contact me if they have any problems with their stomach and/or colon such as nausea, vomiting, bleeding, pain or changes in bowel habits. They understood and agreed to do so.    The patient will not need another screening colonoscopy for 10 years which is according to ASGE guidelines. However, if in the future the patient has any problems with abdominal pain, changes in bowel habits, blood in stool, etc., then they should contact me because they may be a candidate for diagnostic colonoscopy before the 10 year time limit.    The patient will increase her Carafate  to twice a day  Greely Atiyeh B. Takeem Krotzer, MD, MBA, FACS  [facility] -General Surgery

## 2024-07-28 ENCOUNTER — Telehealth (INDEPENDENT_AMBULATORY_CARE_PROVIDER_SITE_OTHER): Payer: Self-pay | Admitting: Surgery

## 2024-07-28 LAB — SURGICAL PATHOLOGY SPECIMEN

## 2024-10-24 ENCOUNTER — Other Ambulatory Visit (HOSPITAL_COMMUNITY): Payer: Self-pay | Admitting: PHYSICIAN ASSISTANT

## 2024-10-24 DIAGNOSIS — Z780 Asymptomatic menopausal state: Secondary | ICD-10-CM

## 2024-10-24 DIAGNOSIS — Z1231 Encounter for screening mammogram for malignant neoplasm of breast: Secondary | ICD-10-CM

## 2024-11-18 ENCOUNTER — Ambulatory Visit (HOSPITAL_COMMUNITY)

## 2024-12-06 ENCOUNTER — Other Ambulatory Visit: Payer: Self-pay

## 2024-12-06 ENCOUNTER — Ambulatory Visit
Admission: RE | Admit: 2024-12-06 | Discharge: 2024-12-06 | Disposition: A | Source: Ambulatory Visit | Attending: PHYSICIAN ASSISTANT

## 2024-12-06 DIAGNOSIS — Z780 Asymptomatic menopausal state: Secondary | ICD-10-CM
